# Patient Record
Sex: Male | Born: 1935
Health system: Southern US, Community
[De-identification: ages and names within clinical notes are randomized; demographics above are authoritative.]

## PROBLEM LIST (undated history)

## (undated) DIAGNOSIS — C801 Malignant (primary) neoplasm, unspecified: Secondary | ICD-10-CM

## (undated) HISTORY — PX: CATARACT EXTRACTION: SUR2

## (undated) HISTORY — PX: TONSILLECTOMY: SUR1361

## (undated) HISTORY — PX: REPLACEMENT TOTAL KNEE BILATERAL: SUR1225

---

## 2011-12-19 DIAGNOSIS — I10 Essential (primary) hypertension: Secondary | ICD-10-CM | POA: Diagnosis not present

## 2011-12-19 DIAGNOSIS — D649 Anemia, unspecified: Secondary | ICD-10-CM | POA: Diagnosis not present

## 2011-12-19 DIAGNOSIS — E7800 Pure hypercholesterolemia, unspecified: Secondary | ICD-10-CM | POA: Diagnosis not present

## 2011-12-19 DIAGNOSIS — D510 Vitamin B12 deficiency anemia due to intrinsic factor deficiency: Secondary | ICD-10-CM | POA: Diagnosis not present

## 2011-12-20 DIAGNOSIS — E7800 Pure hypercholesterolemia, unspecified: Secondary | ICD-10-CM | POA: Diagnosis not present

## 2011-12-20 DIAGNOSIS — D518 Other vitamin B12 deficiency anemias: Secondary | ICD-10-CM | POA: Diagnosis not present

## 2011-12-20 DIAGNOSIS — I10 Essential (primary) hypertension: Secondary | ICD-10-CM | POA: Diagnosis not present

## 2012-05-23 DIAGNOSIS — D649 Anemia, unspecified: Secondary | ICD-10-CM | POA: Diagnosis not present

## 2012-05-24 DIAGNOSIS — D509 Iron deficiency anemia, unspecified: Secondary | ICD-10-CM | POA: Diagnosis not present

## 2012-05-24 DIAGNOSIS — E538 Deficiency of other specified B group vitamins: Secondary | ICD-10-CM | POA: Diagnosis not present

## 2012-06-20 DIAGNOSIS — D509 Iron deficiency anemia, unspecified: Secondary | ICD-10-CM | POA: Diagnosis not present

## 2012-06-20 DIAGNOSIS — I10 Essential (primary) hypertension: Secondary | ICD-10-CM | POA: Diagnosis not present

## 2012-08-29 DIAGNOSIS — H2700 Aphakia, unspecified eye: Secondary | ICD-10-CM | POA: Diagnosis not present

## 2012-11-26 DIAGNOSIS — R29818 Other symptoms and signs involving the nervous system: Secondary | ICD-10-CM | POA: Diagnosis not present

## 2012-11-26 DIAGNOSIS — R5381 Other malaise: Secondary | ICD-10-CM | POA: Diagnosis not present

## 2012-11-26 DIAGNOSIS — I6789 Other cerebrovascular disease: Secondary | ICD-10-CM | POA: Diagnosis not present

## 2012-11-27 DIAGNOSIS — R29810 Facial weakness: Secondary | ICD-10-CM | POA: Diagnosis not present

## 2012-11-27 DIAGNOSIS — E538 Deficiency of other specified B group vitamins: Secondary | ICD-10-CM | POA: Diagnosis not present

## 2012-11-27 DIAGNOSIS — I5021 Acute systolic (congestive) heart failure: Secondary | ICD-10-CM | POA: Diagnosis not present

## 2012-11-27 DIAGNOSIS — M25449 Effusion, unspecified hand: Secondary | ICD-10-CM | POA: Diagnosis not present

## 2012-11-27 DIAGNOSIS — N289 Disorder of kidney and ureter, unspecified: Secondary | ICD-10-CM | POA: Diagnosis present

## 2012-11-27 DIAGNOSIS — Z96659 Presence of unspecified artificial knee joint: Secondary | ICD-10-CM | POA: Diagnosis not present

## 2012-11-27 DIAGNOSIS — E785 Hyperlipidemia, unspecified: Secondary | ICD-10-CM | POA: Diagnosis not present

## 2012-11-27 DIAGNOSIS — R7309 Other abnormal glucose: Secondary | ICD-10-CM | POA: Diagnosis not present

## 2012-11-27 DIAGNOSIS — R209 Unspecified disturbances of skin sensation: Secondary | ICD-10-CM | POA: Diagnosis not present

## 2012-11-27 DIAGNOSIS — I619 Nontraumatic intracerebral hemorrhage, unspecified: Secondary | ICD-10-CM | POA: Diagnosis not present

## 2012-11-27 DIAGNOSIS — I6350 Cerebral infarction due to unspecified occlusion or stenosis of unspecified cerebral artery: Secondary | ICD-10-CM | POA: Diagnosis not present

## 2012-11-27 DIAGNOSIS — I4891 Unspecified atrial fibrillation: Secondary | ICD-10-CM | POA: Diagnosis present

## 2012-11-27 DIAGNOSIS — I2589 Other forms of chronic ischemic heart disease: Secondary | ICD-10-CM | POA: Diagnosis not present

## 2012-11-27 DIAGNOSIS — I6789 Other cerebrovascular disease: Secondary | ICD-10-CM | POA: Diagnosis not present

## 2012-11-27 DIAGNOSIS — M6281 Muscle weakness (generalized): Secondary | ICD-10-CM | POA: Diagnosis not present

## 2012-11-27 DIAGNOSIS — G8190 Hemiplegia, unspecified affecting unspecified side: Secondary | ICD-10-CM | POA: Diagnosis present

## 2012-11-27 DIAGNOSIS — I6340 Cerebral infarction due to embolism of unspecified cerebral artery: Secondary | ICD-10-CM | POA: Diagnosis present

## 2012-11-27 DIAGNOSIS — I10 Essential (primary) hypertension: Secondary | ICD-10-CM | POA: Diagnosis not present

## 2012-12-05 DIAGNOSIS — Z5189 Encounter for other specified aftercare: Secondary | ICD-10-CM | POA: Diagnosis not present

## 2012-12-05 DIAGNOSIS — I6789 Other cerebrovascular disease: Secondary | ICD-10-CM | POA: Diagnosis not present

## 2012-12-05 DIAGNOSIS — R1312 Dysphagia, oropharyngeal phase: Secondary | ICD-10-CM | POA: Diagnosis not present

## 2012-12-05 DIAGNOSIS — M6281 Muscle weakness (generalized): Secondary | ICD-10-CM | POA: Diagnosis not present

## 2012-12-05 DIAGNOSIS — R279 Unspecified lack of coordination: Secondary | ICD-10-CM | POA: Diagnosis not present

## 2012-12-05 DIAGNOSIS — R269 Unspecified abnormalities of gait and mobility: Secondary | ICD-10-CM | POA: Diagnosis not present

## 2012-12-06 DIAGNOSIS — R269 Unspecified abnormalities of gait and mobility: Secondary | ICD-10-CM | POA: Diagnosis not present

## 2012-12-06 DIAGNOSIS — I6789 Other cerebrovascular disease: Secondary | ICD-10-CM | POA: Diagnosis not present

## 2012-12-06 DIAGNOSIS — M6281 Muscle weakness (generalized): Secondary | ICD-10-CM | POA: Diagnosis not present

## 2012-12-06 DIAGNOSIS — Z5189 Encounter for other specified aftercare: Secondary | ICD-10-CM | POA: Diagnosis not present

## 2012-12-06 DIAGNOSIS — R279 Unspecified lack of coordination: Secondary | ICD-10-CM | POA: Diagnosis not present

## 2012-12-06 DIAGNOSIS — I69959 Hemiplegia and hemiparesis following unspecified cerebrovascular disease affecting unspecified side: Secondary | ICD-10-CM | POA: Diagnosis not present

## 2012-12-06 DIAGNOSIS — R1312 Dysphagia, oropharyngeal phase: Secondary | ICD-10-CM | POA: Diagnosis not present

## 2012-12-09 DIAGNOSIS — R269 Unspecified abnormalities of gait and mobility: Secondary | ICD-10-CM | POA: Diagnosis not present

## 2012-12-09 DIAGNOSIS — R279 Unspecified lack of coordination: Secondary | ICD-10-CM | POA: Diagnosis not present

## 2012-12-09 DIAGNOSIS — Z5189 Encounter for other specified aftercare: Secondary | ICD-10-CM | POA: Diagnosis not present

## 2012-12-09 DIAGNOSIS — M6281 Muscle weakness (generalized): Secondary | ICD-10-CM | POA: Diagnosis not present

## 2012-12-09 DIAGNOSIS — I6789 Other cerebrovascular disease: Secondary | ICD-10-CM | POA: Diagnosis not present

## 2012-12-09 DIAGNOSIS — R1312 Dysphagia, oropharyngeal phase: Secondary | ICD-10-CM | POA: Diagnosis not present

## 2012-12-10 DIAGNOSIS — M6281 Muscle weakness (generalized): Secondary | ICD-10-CM | POA: Diagnosis not present

## 2012-12-10 DIAGNOSIS — R279 Unspecified lack of coordination: Secondary | ICD-10-CM | POA: Diagnosis not present

## 2012-12-10 DIAGNOSIS — Z5189 Encounter for other specified aftercare: Secondary | ICD-10-CM | POA: Diagnosis not present

## 2012-12-10 DIAGNOSIS — I6789 Other cerebrovascular disease: Secondary | ICD-10-CM | POA: Diagnosis not present

## 2012-12-10 DIAGNOSIS — R269 Unspecified abnormalities of gait and mobility: Secondary | ICD-10-CM | POA: Diagnosis not present

## 2012-12-10 DIAGNOSIS — R1312 Dysphagia, oropharyngeal phase: Secondary | ICD-10-CM | POA: Diagnosis not present

## 2012-12-11 DIAGNOSIS — Z5189 Encounter for other specified aftercare: Secondary | ICD-10-CM | POA: Diagnosis not present

## 2012-12-11 DIAGNOSIS — R269 Unspecified abnormalities of gait and mobility: Secondary | ICD-10-CM | POA: Diagnosis not present

## 2012-12-11 DIAGNOSIS — R1312 Dysphagia, oropharyngeal phase: Secondary | ICD-10-CM | POA: Diagnosis not present

## 2012-12-11 DIAGNOSIS — M6281 Muscle weakness (generalized): Secondary | ICD-10-CM | POA: Diagnosis not present

## 2012-12-11 DIAGNOSIS — I6789 Other cerebrovascular disease: Secondary | ICD-10-CM | POA: Diagnosis not present

## 2012-12-11 DIAGNOSIS — R279 Unspecified lack of coordination: Secondary | ICD-10-CM | POA: Diagnosis not present

## 2012-12-12 DIAGNOSIS — R279 Unspecified lack of coordination: Secondary | ICD-10-CM | POA: Diagnosis not present

## 2012-12-12 DIAGNOSIS — Z5189 Encounter for other specified aftercare: Secondary | ICD-10-CM | POA: Diagnosis not present

## 2012-12-12 DIAGNOSIS — R269 Unspecified abnormalities of gait and mobility: Secondary | ICD-10-CM | POA: Diagnosis not present

## 2012-12-12 DIAGNOSIS — M6281 Muscle weakness (generalized): Secondary | ICD-10-CM | POA: Diagnosis not present

## 2012-12-12 DIAGNOSIS — R1312 Dysphagia, oropharyngeal phase: Secondary | ICD-10-CM | POA: Diagnosis not present

## 2012-12-12 DIAGNOSIS — I6789 Other cerebrovascular disease: Secondary | ICD-10-CM | POA: Diagnosis not present

## 2012-12-13 DIAGNOSIS — R1312 Dysphagia, oropharyngeal phase: Secondary | ICD-10-CM | POA: Diagnosis not present

## 2012-12-13 DIAGNOSIS — I6789 Other cerebrovascular disease: Secondary | ICD-10-CM | POA: Diagnosis not present

## 2012-12-13 DIAGNOSIS — M6281 Muscle weakness (generalized): Secondary | ICD-10-CM | POA: Diagnosis not present

## 2012-12-13 DIAGNOSIS — R279 Unspecified lack of coordination: Secondary | ICD-10-CM | POA: Diagnosis not present

## 2012-12-13 DIAGNOSIS — R269 Unspecified abnormalities of gait and mobility: Secondary | ICD-10-CM | POA: Diagnosis not present

## 2012-12-13 DIAGNOSIS — Z5189 Encounter for other specified aftercare: Secondary | ICD-10-CM | POA: Diagnosis not present

## 2012-12-16 DIAGNOSIS — M6281 Muscle weakness (generalized): Secondary | ICD-10-CM | POA: Diagnosis not present

## 2012-12-16 DIAGNOSIS — R1312 Dysphagia, oropharyngeal phase: Secondary | ICD-10-CM | POA: Diagnosis not present

## 2012-12-16 DIAGNOSIS — R279 Unspecified lack of coordination: Secondary | ICD-10-CM | POA: Diagnosis not present

## 2012-12-16 DIAGNOSIS — I6789 Other cerebrovascular disease: Secondary | ICD-10-CM | POA: Diagnosis not present

## 2012-12-16 DIAGNOSIS — Z5189 Encounter for other specified aftercare: Secondary | ICD-10-CM | POA: Diagnosis not present

## 2012-12-16 DIAGNOSIS — R269 Unspecified abnormalities of gait and mobility: Secondary | ICD-10-CM | POA: Diagnosis not present

## 2012-12-17 DIAGNOSIS — Z5189 Encounter for other specified aftercare: Secondary | ICD-10-CM | POA: Diagnosis not present

## 2012-12-17 DIAGNOSIS — R269 Unspecified abnormalities of gait and mobility: Secondary | ICD-10-CM | POA: Diagnosis not present

## 2012-12-17 DIAGNOSIS — I6789 Other cerebrovascular disease: Secondary | ICD-10-CM | POA: Diagnosis not present

## 2012-12-17 DIAGNOSIS — R279 Unspecified lack of coordination: Secondary | ICD-10-CM | POA: Diagnosis not present

## 2012-12-17 DIAGNOSIS — M6281 Muscle weakness (generalized): Secondary | ICD-10-CM | POA: Diagnosis not present

## 2012-12-17 DIAGNOSIS — R1312 Dysphagia, oropharyngeal phase: Secondary | ICD-10-CM | POA: Diagnosis not present

## 2012-12-18 DIAGNOSIS — R279 Unspecified lack of coordination: Secondary | ICD-10-CM | POA: Diagnosis not present

## 2012-12-18 DIAGNOSIS — I6789 Other cerebrovascular disease: Secondary | ICD-10-CM | POA: Diagnosis not present

## 2012-12-18 DIAGNOSIS — R1312 Dysphagia, oropharyngeal phase: Secondary | ICD-10-CM | POA: Diagnosis not present

## 2012-12-18 DIAGNOSIS — R269 Unspecified abnormalities of gait and mobility: Secondary | ICD-10-CM | POA: Diagnosis not present

## 2012-12-18 DIAGNOSIS — M6281 Muscle weakness (generalized): Secondary | ICD-10-CM | POA: Diagnosis not present

## 2012-12-18 DIAGNOSIS — Z5189 Encounter for other specified aftercare: Secondary | ICD-10-CM | POA: Diagnosis not present

## 2012-12-19 DIAGNOSIS — M6281 Muscle weakness (generalized): Secondary | ICD-10-CM | POA: Diagnosis not present

## 2012-12-19 DIAGNOSIS — R269 Unspecified abnormalities of gait and mobility: Secondary | ICD-10-CM | POA: Diagnosis not present

## 2012-12-19 DIAGNOSIS — Z5189 Encounter for other specified aftercare: Secondary | ICD-10-CM | POA: Diagnosis not present

## 2012-12-19 DIAGNOSIS — R1312 Dysphagia, oropharyngeal phase: Secondary | ICD-10-CM | POA: Diagnosis not present

## 2012-12-19 DIAGNOSIS — R279 Unspecified lack of coordination: Secondary | ICD-10-CM | POA: Diagnosis not present

## 2012-12-19 DIAGNOSIS — I6789 Other cerebrovascular disease: Secondary | ICD-10-CM | POA: Diagnosis not present

## 2012-12-20 DIAGNOSIS — R1312 Dysphagia, oropharyngeal phase: Secondary | ICD-10-CM | POA: Diagnosis not present

## 2012-12-20 DIAGNOSIS — R269 Unspecified abnormalities of gait and mobility: Secondary | ICD-10-CM | POA: Diagnosis not present

## 2012-12-20 DIAGNOSIS — R279 Unspecified lack of coordination: Secondary | ICD-10-CM | POA: Diagnosis not present

## 2012-12-20 DIAGNOSIS — I6789 Other cerebrovascular disease: Secondary | ICD-10-CM | POA: Diagnosis not present

## 2012-12-20 DIAGNOSIS — M6281 Muscle weakness (generalized): Secondary | ICD-10-CM | POA: Diagnosis not present

## 2012-12-20 DIAGNOSIS — Z5189 Encounter for other specified aftercare: Secondary | ICD-10-CM | POA: Diagnosis not present

## 2012-12-23 DIAGNOSIS — M6281 Muscle weakness (generalized): Secondary | ICD-10-CM | POA: Diagnosis not present

## 2012-12-23 DIAGNOSIS — I6789 Other cerebrovascular disease: Secondary | ICD-10-CM | POA: Diagnosis not present

## 2012-12-23 DIAGNOSIS — Z5189 Encounter for other specified aftercare: Secondary | ICD-10-CM | POA: Diagnosis not present

## 2012-12-23 DIAGNOSIS — R279 Unspecified lack of coordination: Secondary | ICD-10-CM | POA: Diagnosis not present

## 2012-12-23 DIAGNOSIS — R269 Unspecified abnormalities of gait and mobility: Secondary | ICD-10-CM | POA: Diagnosis not present

## 2012-12-23 DIAGNOSIS — R1312 Dysphagia, oropharyngeal phase: Secondary | ICD-10-CM | POA: Diagnosis not present

## 2012-12-24 DIAGNOSIS — Z5189 Encounter for other specified aftercare: Secondary | ICD-10-CM | POA: Diagnosis not present

## 2012-12-24 DIAGNOSIS — R279 Unspecified lack of coordination: Secondary | ICD-10-CM | POA: Diagnosis not present

## 2012-12-24 DIAGNOSIS — I6789 Other cerebrovascular disease: Secondary | ICD-10-CM | POA: Diagnosis not present

## 2012-12-24 DIAGNOSIS — R1312 Dysphagia, oropharyngeal phase: Secondary | ICD-10-CM | POA: Diagnosis not present

## 2012-12-24 DIAGNOSIS — M6281 Muscle weakness (generalized): Secondary | ICD-10-CM | POA: Diagnosis not present

## 2012-12-24 DIAGNOSIS — R269 Unspecified abnormalities of gait and mobility: Secondary | ICD-10-CM | POA: Diagnosis not present

## 2012-12-25 DIAGNOSIS — R269 Unspecified abnormalities of gait and mobility: Secondary | ICD-10-CM | POA: Diagnosis not present

## 2012-12-25 DIAGNOSIS — R1312 Dysphagia, oropharyngeal phase: Secondary | ICD-10-CM | POA: Diagnosis not present

## 2012-12-25 DIAGNOSIS — Z5189 Encounter for other specified aftercare: Secondary | ICD-10-CM | POA: Diagnosis not present

## 2012-12-25 DIAGNOSIS — I6789 Other cerebrovascular disease: Secondary | ICD-10-CM | POA: Diagnosis not present

## 2012-12-25 DIAGNOSIS — M6281 Muscle weakness (generalized): Secondary | ICD-10-CM | POA: Diagnosis not present

## 2012-12-25 DIAGNOSIS — R279 Unspecified lack of coordination: Secondary | ICD-10-CM | POA: Diagnosis not present

## 2012-12-26 DIAGNOSIS — I6350 Cerebral infarction due to unspecified occlusion or stenosis of unspecified cerebral artery: Secondary | ICD-10-CM | POA: Diagnosis not present

## 2012-12-26 DIAGNOSIS — Z5189 Encounter for other specified aftercare: Secondary | ICD-10-CM | POA: Diagnosis not present

## 2012-12-26 DIAGNOSIS — E7800 Pure hypercholesterolemia, unspecified: Secondary | ICD-10-CM | POA: Diagnosis not present

## 2012-12-26 DIAGNOSIS — R1312 Dysphagia, oropharyngeal phase: Secondary | ICD-10-CM | POA: Diagnosis not present

## 2012-12-26 DIAGNOSIS — I4891 Unspecified atrial fibrillation: Secondary | ICD-10-CM | POA: Diagnosis not present

## 2012-12-26 DIAGNOSIS — R269 Unspecified abnormalities of gait and mobility: Secondary | ICD-10-CM | POA: Diagnosis not present

## 2012-12-26 DIAGNOSIS — I6789 Other cerebrovascular disease: Secondary | ICD-10-CM | POA: Diagnosis not present

## 2012-12-26 DIAGNOSIS — R279 Unspecified lack of coordination: Secondary | ICD-10-CM | POA: Diagnosis not present

## 2012-12-26 DIAGNOSIS — M6281 Muscle weakness (generalized): Secondary | ICD-10-CM | POA: Diagnosis not present

## 2012-12-27 DIAGNOSIS — R279 Unspecified lack of coordination: Secondary | ICD-10-CM | POA: Diagnosis not present

## 2012-12-27 DIAGNOSIS — R269 Unspecified abnormalities of gait and mobility: Secondary | ICD-10-CM | POA: Diagnosis not present

## 2012-12-27 DIAGNOSIS — R1312 Dysphagia, oropharyngeal phase: Secondary | ICD-10-CM | POA: Diagnosis not present

## 2012-12-27 DIAGNOSIS — M6281 Muscle weakness (generalized): Secondary | ICD-10-CM | POA: Diagnosis not present

## 2012-12-27 DIAGNOSIS — Z5189 Encounter for other specified aftercare: Secondary | ICD-10-CM | POA: Diagnosis not present

## 2012-12-27 DIAGNOSIS — I6789 Other cerebrovascular disease: Secondary | ICD-10-CM | POA: Diagnosis not present

## 2012-12-30 DIAGNOSIS — M6281 Muscle weakness (generalized): Secondary | ICD-10-CM | POA: Diagnosis not present

## 2012-12-30 DIAGNOSIS — R279 Unspecified lack of coordination: Secondary | ICD-10-CM | POA: Diagnosis not present

## 2012-12-30 DIAGNOSIS — R269 Unspecified abnormalities of gait and mobility: Secondary | ICD-10-CM | POA: Diagnosis not present

## 2012-12-30 DIAGNOSIS — R1312 Dysphagia, oropharyngeal phase: Secondary | ICD-10-CM | POA: Diagnosis not present

## 2012-12-30 DIAGNOSIS — I6789 Other cerebrovascular disease: Secondary | ICD-10-CM | POA: Diagnosis not present

## 2012-12-30 DIAGNOSIS — Z5189 Encounter for other specified aftercare: Secondary | ICD-10-CM | POA: Diagnosis not present

## 2012-12-31 DIAGNOSIS — Z5189 Encounter for other specified aftercare: Secondary | ICD-10-CM | POA: Diagnosis not present

## 2012-12-31 DIAGNOSIS — R269 Unspecified abnormalities of gait and mobility: Secondary | ICD-10-CM | POA: Diagnosis not present

## 2012-12-31 DIAGNOSIS — R1312 Dysphagia, oropharyngeal phase: Secondary | ICD-10-CM | POA: Diagnosis not present

## 2012-12-31 DIAGNOSIS — I6789 Other cerebrovascular disease: Secondary | ICD-10-CM | POA: Diagnosis not present

## 2012-12-31 DIAGNOSIS — R279 Unspecified lack of coordination: Secondary | ICD-10-CM | POA: Diagnosis not present

## 2012-12-31 DIAGNOSIS — M6281 Muscle weakness (generalized): Secondary | ICD-10-CM | POA: Diagnosis not present

## 2013-01-01 DIAGNOSIS — I6789 Other cerebrovascular disease: Secondary | ICD-10-CM | POA: Diagnosis not present

## 2013-01-01 DIAGNOSIS — M6281 Muscle weakness (generalized): Secondary | ICD-10-CM | POA: Diagnosis not present

## 2013-01-01 DIAGNOSIS — R279 Unspecified lack of coordination: Secondary | ICD-10-CM | POA: Diagnosis not present

## 2013-01-01 DIAGNOSIS — R269 Unspecified abnormalities of gait and mobility: Secondary | ICD-10-CM | POA: Diagnosis not present

## 2013-01-01 DIAGNOSIS — Z5189 Encounter for other specified aftercare: Secondary | ICD-10-CM | POA: Diagnosis not present

## 2013-01-01 DIAGNOSIS — R1312 Dysphagia, oropharyngeal phase: Secondary | ICD-10-CM | POA: Diagnosis not present

## 2013-01-02 DIAGNOSIS — Z5189 Encounter for other specified aftercare: Secondary | ICD-10-CM | POA: Diagnosis not present

## 2013-01-02 DIAGNOSIS — M6281 Muscle weakness (generalized): Secondary | ICD-10-CM | POA: Diagnosis not present

## 2013-01-02 DIAGNOSIS — R269 Unspecified abnormalities of gait and mobility: Secondary | ICD-10-CM | POA: Diagnosis not present

## 2013-01-02 DIAGNOSIS — I6789 Other cerebrovascular disease: Secondary | ICD-10-CM | POA: Diagnosis not present

## 2013-01-02 DIAGNOSIS — R1312 Dysphagia, oropharyngeal phase: Secondary | ICD-10-CM | POA: Diagnosis not present

## 2013-01-02 DIAGNOSIS — R279 Unspecified lack of coordination: Secondary | ICD-10-CM | POA: Diagnosis not present

## 2013-01-03 DIAGNOSIS — I6789 Other cerebrovascular disease: Secondary | ICD-10-CM | POA: Diagnosis not present

## 2013-01-03 DIAGNOSIS — Z5189 Encounter for other specified aftercare: Secondary | ICD-10-CM | POA: Diagnosis not present

## 2013-01-03 DIAGNOSIS — R269 Unspecified abnormalities of gait and mobility: Secondary | ICD-10-CM | POA: Diagnosis not present

## 2013-01-03 DIAGNOSIS — R1312 Dysphagia, oropharyngeal phase: Secondary | ICD-10-CM | POA: Diagnosis not present

## 2013-01-03 DIAGNOSIS — R279 Unspecified lack of coordination: Secondary | ICD-10-CM | POA: Diagnosis not present

## 2013-01-03 DIAGNOSIS — M6281 Muscle weakness (generalized): Secondary | ICD-10-CM | POA: Diagnosis not present

## 2013-01-06 DIAGNOSIS — M6281 Muscle weakness (generalized): Secondary | ICD-10-CM | POA: Diagnosis not present

## 2013-01-06 DIAGNOSIS — R279 Unspecified lack of coordination: Secondary | ICD-10-CM | POA: Diagnosis not present

## 2013-01-06 DIAGNOSIS — R1312 Dysphagia, oropharyngeal phase: Secondary | ICD-10-CM | POA: Diagnosis not present

## 2013-01-06 DIAGNOSIS — I6789 Other cerebrovascular disease: Secondary | ICD-10-CM | POA: Diagnosis not present

## 2013-01-06 DIAGNOSIS — Z5189 Encounter for other specified aftercare: Secondary | ICD-10-CM | POA: Diagnosis not present

## 2013-01-06 DIAGNOSIS — R269 Unspecified abnormalities of gait and mobility: Secondary | ICD-10-CM | POA: Diagnosis not present

## 2013-01-07 DIAGNOSIS — I10 Essential (primary) hypertension: Secondary | ICD-10-CM | POA: Diagnosis not present

## 2013-01-07 DIAGNOSIS — I4891 Unspecified atrial fibrillation: Secondary | ICD-10-CM | POA: Diagnosis not present

## 2013-01-07 DIAGNOSIS — E785 Hyperlipidemia, unspecified: Secondary | ICD-10-CM | POA: Diagnosis not present

## 2013-01-08 DIAGNOSIS — R279 Unspecified lack of coordination: Secondary | ICD-10-CM | POA: Diagnosis not present

## 2013-01-08 DIAGNOSIS — R1312 Dysphagia, oropharyngeal phase: Secondary | ICD-10-CM | POA: Diagnosis not present

## 2013-01-08 DIAGNOSIS — I6789 Other cerebrovascular disease: Secondary | ICD-10-CM | POA: Diagnosis not present

## 2013-01-08 DIAGNOSIS — R269 Unspecified abnormalities of gait and mobility: Secondary | ICD-10-CM | POA: Diagnosis not present

## 2013-01-08 DIAGNOSIS — Z5189 Encounter for other specified aftercare: Secondary | ICD-10-CM | POA: Diagnosis not present

## 2013-01-08 DIAGNOSIS — M6281 Muscle weakness (generalized): Secondary | ICD-10-CM | POA: Diagnosis not present

## 2013-01-09 DIAGNOSIS — R279 Unspecified lack of coordination: Secondary | ICD-10-CM | POA: Diagnosis not present

## 2013-01-09 DIAGNOSIS — Z5189 Encounter for other specified aftercare: Secondary | ICD-10-CM | POA: Diagnosis not present

## 2013-01-09 DIAGNOSIS — R269 Unspecified abnormalities of gait and mobility: Secondary | ICD-10-CM | POA: Diagnosis not present

## 2013-01-09 DIAGNOSIS — I6789 Other cerebrovascular disease: Secondary | ICD-10-CM | POA: Diagnosis not present

## 2013-01-09 DIAGNOSIS — R1312 Dysphagia, oropharyngeal phase: Secondary | ICD-10-CM | POA: Diagnosis not present

## 2013-01-09 DIAGNOSIS — M6281 Muscle weakness (generalized): Secondary | ICD-10-CM | POA: Diagnosis not present

## 2013-01-10 DIAGNOSIS — I6350 Cerebral infarction due to unspecified occlusion or stenosis of unspecified cerebral artery: Secondary | ICD-10-CM | POA: Diagnosis not present

## 2013-01-10 DIAGNOSIS — Z961 Presence of intraocular lens: Secondary | ICD-10-CM | POA: Diagnosis not present

## 2013-01-13 DIAGNOSIS — R269 Unspecified abnormalities of gait and mobility: Secondary | ICD-10-CM | POA: Diagnosis not present

## 2013-01-13 DIAGNOSIS — Z5189 Encounter for other specified aftercare: Secondary | ICD-10-CM | POA: Diagnosis not present

## 2013-01-13 DIAGNOSIS — M6281 Muscle weakness (generalized): Secondary | ICD-10-CM | POA: Diagnosis not present

## 2013-01-13 DIAGNOSIS — I6789 Other cerebrovascular disease: Secondary | ICD-10-CM | POA: Diagnosis not present

## 2013-01-13 DIAGNOSIS — R1312 Dysphagia, oropharyngeal phase: Secondary | ICD-10-CM | POA: Diagnosis not present

## 2013-01-13 DIAGNOSIS — R279 Unspecified lack of coordination: Secondary | ICD-10-CM | POA: Diagnosis not present

## 2013-01-14 DIAGNOSIS — Z5189 Encounter for other specified aftercare: Secondary | ICD-10-CM | POA: Diagnosis not present

## 2013-01-14 DIAGNOSIS — R1312 Dysphagia, oropharyngeal phase: Secondary | ICD-10-CM | POA: Diagnosis not present

## 2013-01-14 DIAGNOSIS — M6281 Muscle weakness (generalized): Secondary | ICD-10-CM | POA: Diagnosis not present

## 2013-01-14 DIAGNOSIS — R269 Unspecified abnormalities of gait and mobility: Secondary | ICD-10-CM | POA: Diagnosis not present

## 2013-01-14 DIAGNOSIS — R279 Unspecified lack of coordination: Secondary | ICD-10-CM | POA: Diagnosis not present

## 2013-01-14 DIAGNOSIS — I6789 Other cerebrovascular disease: Secondary | ICD-10-CM | POA: Diagnosis not present

## 2013-01-15 DIAGNOSIS — I6789 Other cerebrovascular disease: Secondary | ICD-10-CM | POA: Diagnosis not present

## 2013-01-15 DIAGNOSIS — D649 Anemia, unspecified: Secondary | ICD-10-CM | POA: Diagnosis not present

## 2013-01-15 DIAGNOSIS — M6281 Muscle weakness (generalized): Secondary | ICD-10-CM | POA: Diagnosis not present

## 2013-01-15 DIAGNOSIS — Z79899 Other long term (current) drug therapy: Secondary | ICD-10-CM | POA: Diagnosis not present

## 2013-01-15 DIAGNOSIS — Z5189 Encounter for other specified aftercare: Secondary | ICD-10-CM | POA: Diagnosis not present

## 2013-01-15 DIAGNOSIS — R269 Unspecified abnormalities of gait and mobility: Secondary | ICD-10-CM | POA: Diagnosis not present

## 2013-01-15 DIAGNOSIS — R1312 Dysphagia, oropharyngeal phase: Secondary | ICD-10-CM | POA: Diagnosis not present

## 2013-01-15 DIAGNOSIS — R279 Unspecified lack of coordination: Secondary | ICD-10-CM | POA: Diagnosis not present

## 2013-01-16 DIAGNOSIS — I6340 Cerebral infarction due to embolism of unspecified cerebral artery: Secondary | ICD-10-CM | POA: Diagnosis not present

## 2013-01-16 DIAGNOSIS — H53469 Homonymous bilateral field defects, unspecified side: Secondary | ICD-10-CM | POA: Diagnosis not present

## 2013-01-21 DIAGNOSIS — I4891 Unspecified atrial fibrillation: Secondary | ICD-10-CM | POA: Diagnosis not present

## 2013-01-21 DIAGNOSIS — I6789 Other cerebrovascular disease: Secondary | ICD-10-CM | POA: Diagnosis not present

## 2013-01-21 DIAGNOSIS — I4460 Unspecified fascicular block: Secondary | ICD-10-CM | POA: Diagnosis not present

## 2013-01-21 DIAGNOSIS — E785 Hyperlipidemia, unspecified: Secondary | ICD-10-CM | POA: Diagnosis not present

## 2013-01-21 DIAGNOSIS — I10 Essential (primary) hypertension: Secondary | ICD-10-CM | POA: Diagnosis not present

## 2013-01-22 DIAGNOSIS — M6281 Muscle weakness (generalized): Secondary | ICD-10-CM | POA: Diagnosis not present

## 2013-01-22 DIAGNOSIS — Z5189 Encounter for other specified aftercare: Secondary | ICD-10-CM | POA: Diagnosis not present

## 2013-01-22 DIAGNOSIS — R279 Unspecified lack of coordination: Secondary | ICD-10-CM | POA: Diagnosis not present

## 2013-01-22 DIAGNOSIS — I6789 Other cerebrovascular disease: Secondary | ICD-10-CM | POA: Diagnosis not present

## 2013-01-23 DIAGNOSIS — R279 Unspecified lack of coordination: Secondary | ICD-10-CM | POA: Diagnosis not present

## 2013-01-23 DIAGNOSIS — Z5189 Encounter for other specified aftercare: Secondary | ICD-10-CM | POA: Diagnosis not present

## 2013-01-23 DIAGNOSIS — I6789 Other cerebrovascular disease: Secondary | ICD-10-CM | POA: Diagnosis not present

## 2013-01-23 DIAGNOSIS — M6281 Muscle weakness (generalized): Secondary | ICD-10-CM | POA: Diagnosis not present

## 2013-01-24 DIAGNOSIS — R279 Unspecified lack of coordination: Secondary | ICD-10-CM | POA: Diagnosis not present

## 2013-01-24 DIAGNOSIS — M6281 Muscle weakness (generalized): Secondary | ICD-10-CM | POA: Diagnosis not present

## 2013-01-24 DIAGNOSIS — Z5189 Encounter for other specified aftercare: Secondary | ICD-10-CM | POA: Diagnosis not present

## 2013-01-24 DIAGNOSIS — I6789 Other cerebrovascular disease: Secondary | ICD-10-CM | POA: Diagnosis not present

## 2013-01-27 DIAGNOSIS — R279 Unspecified lack of coordination: Secondary | ICD-10-CM | POA: Diagnosis not present

## 2013-01-27 DIAGNOSIS — M6281 Muscle weakness (generalized): Secondary | ICD-10-CM | POA: Diagnosis not present

## 2013-01-27 DIAGNOSIS — Z5189 Encounter for other specified aftercare: Secondary | ICD-10-CM | POA: Diagnosis not present

## 2013-01-27 DIAGNOSIS — I6789 Other cerebrovascular disease: Secondary | ICD-10-CM | POA: Diagnosis not present

## 2013-01-28 DIAGNOSIS — I10 Essential (primary) hypertension: Secondary | ICD-10-CM | POA: Diagnosis not present

## 2013-01-28 DIAGNOSIS — I4891 Unspecified atrial fibrillation: Secondary | ICD-10-CM | POA: Diagnosis not present

## 2013-01-28 DIAGNOSIS — E785 Hyperlipidemia, unspecified: Secondary | ICD-10-CM | POA: Diagnosis not present

## 2013-01-29 DIAGNOSIS — Z5189 Encounter for other specified aftercare: Secondary | ICD-10-CM | POA: Diagnosis not present

## 2013-01-29 DIAGNOSIS — R279 Unspecified lack of coordination: Secondary | ICD-10-CM | POA: Diagnosis not present

## 2013-01-29 DIAGNOSIS — I6789 Other cerebrovascular disease: Secondary | ICD-10-CM | POA: Diagnosis not present

## 2013-01-29 DIAGNOSIS — M6281 Muscle weakness (generalized): Secondary | ICD-10-CM | POA: Diagnosis not present

## 2013-01-30 DIAGNOSIS — M6281 Muscle weakness (generalized): Secondary | ICD-10-CM | POA: Diagnosis not present

## 2013-01-30 DIAGNOSIS — R279 Unspecified lack of coordination: Secondary | ICD-10-CM | POA: Diagnosis not present

## 2013-01-30 DIAGNOSIS — Z5189 Encounter for other specified aftercare: Secondary | ICD-10-CM | POA: Diagnosis not present

## 2013-01-30 DIAGNOSIS — I6789 Other cerebrovascular disease: Secondary | ICD-10-CM | POA: Diagnosis not present

## 2013-02-03 DIAGNOSIS — Z5189 Encounter for other specified aftercare: Secondary | ICD-10-CM | POA: Diagnosis not present

## 2013-02-03 DIAGNOSIS — M6281 Muscle weakness (generalized): Secondary | ICD-10-CM | POA: Diagnosis not present

## 2013-02-03 DIAGNOSIS — I6789 Other cerebrovascular disease: Secondary | ICD-10-CM | POA: Diagnosis not present

## 2013-02-03 DIAGNOSIS — R279 Unspecified lack of coordination: Secondary | ICD-10-CM | POA: Diagnosis not present

## 2013-02-04 DIAGNOSIS — R279 Unspecified lack of coordination: Secondary | ICD-10-CM | POA: Diagnosis not present

## 2013-02-04 DIAGNOSIS — I6789 Other cerebrovascular disease: Secondary | ICD-10-CM | POA: Diagnosis not present

## 2013-02-04 DIAGNOSIS — M6281 Muscle weakness (generalized): Secondary | ICD-10-CM | POA: Diagnosis not present

## 2013-02-04 DIAGNOSIS — Z5189 Encounter for other specified aftercare: Secondary | ICD-10-CM | POA: Diagnosis not present

## 2013-02-05 DIAGNOSIS — M6281 Muscle weakness (generalized): Secondary | ICD-10-CM | POA: Diagnosis not present

## 2013-02-05 DIAGNOSIS — I6789 Other cerebrovascular disease: Secondary | ICD-10-CM | POA: Diagnosis not present

## 2013-02-05 DIAGNOSIS — Z5189 Encounter for other specified aftercare: Secondary | ICD-10-CM | POA: Diagnosis not present

## 2013-02-05 DIAGNOSIS — R279 Unspecified lack of coordination: Secondary | ICD-10-CM | POA: Diagnosis not present

## 2013-02-10 DIAGNOSIS — I6789 Other cerebrovascular disease: Secondary | ICD-10-CM | POA: Diagnosis not present

## 2013-02-10 DIAGNOSIS — R279 Unspecified lack of coordination: Secondary | ICD-10-CM | POA: Diagnosis not present

## 2013-02-10 DIAGNOSIS — M6281 Muscle weakness (generalized): Secondary | ICD-10-CM | POA: Diagnosis not present

## 2013-02-10 DIAGNOSIS — Z5189 Encounter for other specified aftercare: Secondary | ICD-10-CM | POA: Diagnosis not present

## 2013-02-11 DIAGNOSIS — R279 Unspecified lack of coordination: Secondary | ICD-10-CM | POA: Diagnosis not present

## 2013-02-11 DIAGNOSIS — M6281 Muscle weakness (generalized): Secondary | ICD-10-CM | POA: Diagnosis not present

## 2013-02-11 DIAGNOSIS — I6789 Other cerebrovascular disease: Secondary | ICD-10-CM | POA: Diagnosis not present

## 2013-02-11 DIAGNOSIS — Z5189 Encounter for other specified aftercare: Secondary | ICD-10-CM | POA: Diagnosis not present

## 2013-02-12 DIAGNOSIS — I6789 Other cerebrovascular disease: Secondary | ICD-10-CM | POA: Diagnosis not present

## 2013-02-12 DIAGNOSIS — Z5189 Encounter for other specified aftercare: Secondary | ICD-10-CM | POA: Diagnosis not present

## 2013-02-12 DIAGNOSIS — R279 Unspecified lack of coordination: Secondary | ICD-10-CM | POA: Diagnosis not present

## 2013-02-12 DIAGNOSIS — M6281 Muscle weakness (generalized): Secondary | ICD-10-CM | POA: Diagnosis not present

## 2013-02-17 DIAGNOSIS — R279 Unspecified lack of coordination: Secondary | ICD-10-CM | POA: Diagnosis not present

## 2013-02-17 DIAGNOSIS — I6789 Other cerebrovascular disease: Secondary | ICD-10-CM | POA: Diagnosis not present

## 2013-02-17 DIAGNOSIS — M6281 Muscle weakness (generalized): Secondary | ICD-10-CM | POA: Diagnosis not present

## 2013-02-17 DIAGNOSIS — Z5189 Encounter for other specified aftercare: Secondary | ICD-10-CM | POA: Diagnosis not present

## 2013-02-18 DIAGNOSIS — Z5189 Encounter for other specified aftercare: Secondary | ICD-10-CM | POA: Diagnosis not present

## 2013-02-18 DIAGNOSIS — R279 Unspecified lack of coordination: Secondary | ICD-10-CM | POA: Diagnosis not present

## 2013-02-18 DIAGNOSIS — M6281 Muscle weakness (generalized): Secondary | ICD-10-CM | POA: Diagnosis not present

## 2013-02-18 DIAGNOSIS — I6789 Other cerebrovascular disease: Secondary | ICD-10-CM | POA: Diagnosis not present

## 2013-02-19 DIAGNOSIS — I6789 Other cerebrovascular disease: Secondary | ICD-10-CM | POA: Diagnosis not present

## 2013-02-19 DIAGNOSIS — Z5189 Encounter for other specified aftercare: Secondary | ICD-10-CM | POA: Diagnosis not present

## 2013-02-19 DIAGNOSIS — M6281 Muscle weakness (generalized): Secondary | ICD-10-CM | POA: Diagnosis not present

## 2013-04-03 DIAGNOSIS — R5381 Other malaise: Secondary | ICD-10-CM | POA: Diagnosis not present

## 2013-04-03 DIAGNOSIS — I10 Essential (primary) hypertension: Secondary | ICD-10-CM | POA: Diagnosis not present

## 2013-04-03 DIAGNOSIS — E785 Hyperlipidemia, unspecified: Secondary | ICD-10-CM | POA: Diagnosis not present

## 2013-04-03 DIAGNOSIS — I4891 Unspecified atrial fibrillation: Secondary | ICD-10-CM | POA: Diagnosis not present

## 2013-04-07 DIAGNOSIS — I69959 Hemiplegia and hemiparesis following unspecified cerebrovascular disease affecting unspecified side: Secondary | ICD-10-CM | POA: Diagnosis not present

## 2013-04-07 DIAGNOSIS — E7800 Pure hypercholesterolemia, unspecified: Secondary | ICD-10-CM | POA: Diagnosis not present

## 2013-04-07 DIAGNOSIS — I4891 Unspecified atrial fibrillation: Secondary | ICD-10-CM | POA: Diagnosis not present

## 2013-07-14 DIAGNOSIS — Z79899 Other long term (current) drug therapy: Secondary | ICD-10-CM | POA: Diagnosis not present

## 2013-07-22 DIAGNOSIS — I4891 Unspecified atrial fibrillation: Secondary | ICD-10-CM | POA: Diagnosis not present

## 2013-07-22 DIAGNOSIS — I6789 Other cerebrovascular disease: Secondary | ICD-10-CM | POA: Diagnosis not present

## 2013-07-22 DIAGNOSIS — E7800 Pure hypercholesterolemia, unspecified: Secondary | ICD-10-CM | POA: Diagnosis not present

## 2013-09-23 DIAGNOSIS — E785 Hyperlipidemia, unspecified: Secondary | ICD-10-CM | POA: Diagnosis not present

## 2013-09-23 DIAGNOSIS — R609 Edema, unspecified: Secondary | ICD-10-CM | POA: Diagnosis not present

## 2013-09-23 DIAGNOSIS — I4891 Unspecified atrial fibrillation: Secondary | ICD-10-CM | POA: Diagnosis not present

## 2013-09-23 DIAGNOSIS — I6789 Other cerebrovascular disease: Secondary | ICD-10-CM | POA: Diagnosis not present

## 2013-11-26 DIAGNOSIS — I10 Essential (primary) hypertension: Secondary | ICD-10-CM | POA: Diagnosis not present

## 2013-11-26 DIAGNOSIS — E785 Hyperlipidemia, unspecified: Secondary | ICD-10-CM | POA: Diagnosis not present

## 2013-11-27 DIAGNOSIS — E785 Hyperlipidemia, unspecified: Secondary | ICD-10-CM | POA: Diagnosis not present

## 2013-11-27 DIAGNOSIS — I4891 Unspecified atrial fibrillation: Secondary | ICD-10-CM | POA: Diagnosis not present

## 2013-11-27 DIAGNOSIS — Z7901 Long term (current) use of anticoagulants: Secondary | ICD-10-CM | POA: Diagnosis not present

## 2013-11-27 DIAGNOSIS — I6990 Unspecified sequelae of unspecified cerebrovascular disease: Secondary | ICD-10-CM | POA: Diagnosis not present

## 2013-12-03 DIAGNOSIS — I4891 Unspecified atrial fibrillation: Secondary | ICD-10-CM | POA: Diagnosis not present

## 2013-12-03 DIAGNOSIS — I119 Hypertensive heart disease without heart failure: Secondary | ICD-10-CM | POA: Diagnosis not present

## 2013-12-17 DIAGNOSIS — I4891 Unspecified atrial fibrillation: Secondary | ICD-10-CM | POA: Diagnosis not present

## 2013-12-22 DIAGNOSIS — I4891 Unspecified atrial fibrillation: Secondary | ICD-10-CM | POA: Diagnosis not present

## 2014-05-19 DIAGNOSIS — I639 Cerebral infarction, unspecified: Secondary | ICD-10-CM | POA: Diagnosis not present

## 2014-05-19 DIAGNOSIS — E785 Hyperlipidemia, unspecified: Secondary | ICD-10-CM | POA: Diagnosis not present

## 2014-05-19 DIAGNOSIS — I10 Essential (primary) hypertension: Secondary | ICD-10-CM | POA: Diagnosis not present

## 2014-05-27 DIAGNOSIS — H26493 Other secondary cataract, bilateral: Secondary | ICD-10-CM | POA: Diagnosis not present

## 2014-06-16 DIAGNOSIS — E785 Hyperlipidemia, unspecified: Secondary | ICD-10-CM | POA: Diagnosis not present

## 2014-06-16 DIAGNOSIS — Z7901 Long term (current) use of anticoagulants: Secondary | ICD-10-CM | POA: Diagnosis not present

## 2014-06-16 DIAGNOSIS — I10 Essential (primary) hypertension: Secondary | ICD-10-CM | POA: Diagnosis not present

## 2014-06-16 DIAGNOSIS — G8194 Hemiplegia, unspecified affecting left nondominant side: Secondary | ICD-10-CM | POA: Diagnosis not present

## 2014-06-25 DIAGNOSIS — E785 Hyperlipidemia, unspecified: Secondary | ICD-10-CM | POA: Diagnosis not present

## 2014-06-25 DIAGNOSIS — I119 Hypertensive heart disease without heart failure: Secondary | ICD-10-CM | POA: Diagnosis not present

## 2014-06-25 DIAGNOSIS — I482 Chronic atrial fibrillation: Secondary | ICD-10-CM | POA: Diagnosis not present

## 2014-06-25 DIAGNOSIS — Z7901 Long term (current) use of anticoagulants: Secondary | ICD-10-CM | POA: Diagnosis not present

## 2014-11-17 DIAGNOSIS — E785 Hyperlipidemia, unspecified: Secondary | ICD-10-CM | POA: Diagnosis not present

## 2014-11-17 DIAGNOSIS — I10 Essential (primary) hypertension: Secondary | ICD-10-CM | POA: Diagnosis not present

## 2014-11-17 DIAGNOSIS — G8194 Hemiplegia, unspecified affecting left nondominant side: Secondary | ICD-10-CM | POA: Diagnosis not present

## 2014-11-17 DIAGNOSIS — I4891 Unspecified atrial fibrillation: Secondary | ICD-10-CM | POA: Diagnosis not present

## 2014-11-17 DIAGNOSIS — Z7901 Long term (current) use of anticoagulants: Secondary | ICD-10-CM | POA: Diagnosis not present

## 2014-12-15 DIAGNOSIS — I4891 Unspecified atrial fibrillation: Secondary | ICD-10-CM | POA: Diagnosis not present

## 2014-12-15 DIAGNOSIS — I10 Essential (primary) hypertension: Secondary | ICD-10-CM | POA: Diagnosis not present

## 2014-12-15 DIAGNOSIS — E785 Hyperlipidemia, unspecified: Secondary | ICD-10-CM | POA: Diagnosis not present

## 2014-12-15 DIAGNOSIS — I639 Cerebral infarction, unspecified: Secondary | ICD-10-CM | POA: Diagnosis not present

## 2015-02-24 DIAGNOSIS — G8194 Hemiplegia, unspecified affecting left nondominant side: Secondary | ICD-10-CM | POA: Diagnosis not present

## 2015-02-24 DIAGNOSIS — I4891 Unspecified atrial fibrillation: Secondary | ICD-10-CM | POA: Diagnosis not present

## 2015-02-24 DIAGNOSIS — R1902 Left upper quadrant abdominal swelling, mass and lump: Secondary | ICD-10-CM | POA: Diagnosis not present

## 2015-02-24 DIAGNOSIS — Z7901 Long term (current) use of anticoagulants: Secondary | ICD-10-CM | POA: Diagnosis not present

## 2015-02-24 DIAGNOSIS — I10 Essential (primary) hypertension: Secondary | ICD-10-CM | POA: Diagnosis not present

## 2015-03-17 DIAGNOSIS — L570 Actinic keratosis: Secondary | ICD-10-CM | POA: Diagnosis not present

## 2015-03-17 DIAGNOSIS — C44629 Squamous cell carcinoma of skin of left upper limb, including shoulder: Secondary | ICD-10-CM | POA: Diagnosis not present

## 2015-03-17 DIAGNOSIS — L578 Other skin changes due to chronic exposure to nonionizing radiation: Secondary | ICD-10-CM | POA: Diagnosis not present

## 2015-03-17 DIAGNOSIS — C44311 Basal cell carcinoma of skin of nose: Secondary | ICD-10-CM | POA: Diagnosis not present

## 2015-03-29 DIAGNOSIS — I10 Essential (primary) hypertension: Secondary | ICD-10-CM | POA: Diagnosis not present

## 2015-03-29 DIAGNOSIS — K5900 Constipation, unspecified: Secondary | ICD-10-CM | POA: Diagnosis not present

## 2015-03-29 DIAGNOSIS — R1902 Left upper quadrant abdominal swelling, mass and lump: Secondary | ICD-10-CM | POA: Diagnosis not present

## 2015-04-06 DIAGNOSIS — R1012 Left upper quadrant pain: Secondary | ICD-10-CM | POA: Diagnosis not present

## 2015-04-06 DIAGNOSIS — R591 Generalized enlarged lymph nodes: Secondary | ICD-10-CM | POA: Diagnosis not present

## 2015-04-06 DIAGNOSIS — K449 Diaphragmatic hernia without obstruction or gangrene: Secondary | ICD-10-CM | POA: Diagnosis not present

## 2015-04-06 DIAGNOSIS — R1902 Left upper quadrant abdominal swelling, mass and lump: Secondary | ICD-10-CM | POA: Diagnosis not present

## 2015-04-13 DIAGNOSIS — R590 Localized enlarged lymph nodes: Secondary | ICD-10-CM | POA: Diagnosis not present

## 2015-08-20 DIAGNOSIS — I10 Essential (primary) hypertension: Secondary | ICD-10-CM | POA: Diagnosis not present

## 2015-08-20 DIAGNOSIS — H6120 Impacted cerumen, unspecified ear: Secondary | ICD-10-CM | POA: Diagnosis not present

## 2015-08-25 DIAGNOSIS — H6120 Impacted cerumen, unspecified ear: Secondary | ICD-10-CM | POA: Diagnosis not present

## 2016-02-16 DIAGNOSIS — I10 Essential (primary) hypertension: Secondary | ICD-10-CM | POA: Diagnosis not present

## 2016-02-16 DIAGNOSIS — Z1389 Encounter for screening for other disorder: Secondary | ICD-10-CM | POA: Diagnosis not present

## 2016-02-16 DIAGNOSIS — Z6826 Body mass index (BMI) 26.0-26.9, adult: Secondary | ICD-10-CM | POA: Diagnosis not present

## 2016-02-16 DIAGNOSIS — E785 Hyperlipidemia, unspecified: Secondary | ICD-10-CM | POA: Diagnosis not present

## 2016-02-16 DIAGNOSIS — R531 Weakness: Secondary | ICD-10-CM | POA: Diagnosis not present

## 2016-02-16 DIAGNOSIS — R29898 Other symptoms and signs involving the musculoskeletal system: Secondary | ICD-10-CM | POA: Diagnosis not present

## 2016-02-16 DIAGNOSIS — I4891 Unspecified atrial fibrillation: Secondary | ICD-10-CM | POA: Diagnosis not present

## 2016-02-21 DIAGNOSIS — Z1212 Encounter for screening for malignant neoplasm of rectum: Secondary | ICD-10-CM | POA: Diagnosis not present

## 2016-05-24 DIAGNOSIS — C44311 Basal cell carcinoma of skin of nose: Secondary | ICD-10-CM | POA: Diagnosis not present

## 2016-05-24 DIAGNOSIS — D2239 Melanocytic nevi of other parts of face: Secondary | ICD-10-CM | POA: Diagnosis not present

## 2016-05-24 DIAGNOSIS — L570 Actinic keratosis: Secondary | ICD-10-CM | POA: Diagnosis not present

## 2016-09-11 DIAGNOSIS — I10 Essential (primary) hypertension: Secondary | ICD-10-CM | POA: Diagnosis not present

## 2016-09-11 DIAGNOSIS — I482 Chronic atrial fibrillation: Secondary | ICD-10-CM | POA: Diagnosis not present

## 2016-09-11 DIAGNOSIS — H6121 Impacted cerumen, right ear: Secondary | ICD-10-CM | POA: Diagnosis not present

## 2016-09-11 DIAGNOSIS — E785 Hyperlipidemia, unspecified: Secondary | ICD-10-CM | POA: Diagnosis not present

## 2016-09-11 DIAGNOSIS — N183 Chronic kidney disease, stage 3 (moderate): Secondary | ICD-10-CM | POA: Diagnosis not present

## 2016-10-04 DIAGNOSIS — R05 Cough: Secondary | ICD-10-CM | POA: Diagnosis not present

## 2016-10-23 DIAGNOSIS — I482 Chronic atrial fibrillation: Secondary | ICD-10-CM | POA: Diagnosis not present

## 2016-10-23 DIAGNOSIS — I10 Essential (primary) hypertension: Secondary | ICD-10-CM | POA: Diagnosis not present

## 2016-12-19 DIAGNOSIS — D0439 Carcinoma in situ of skin of other parts of face: Secondary | ICD-10-CM | POA: Diagnosis not present

## 2017-02-22 DIAGNOSIS — H6123 Impacted cerumen, bilateral: Secondary | ICD-10-CM | POA: Diagnosis not present

## 2017-03-27 DIAGNOSIS — Z125 Encounter for screening for malignant neoplasm of prostate: Secondary | ICD-10-CM | POA: Diagnosis not present

## 2017-03-27 DIAGNOSIS — N189 Chronic kidney disease, unspecified: Secondary | ICD-10-CM | POA: Diagnosis not present

## 2017-03-27 DIAGNOSIS — Z6824 Body mass index (BMI) 24.0-24.9, adult: Secondary | ICD-10-CM | POA: Diagnosis not present

## 2017-03-27 DIAGNOSIS — I4891 Unspecified atrial fibrillation: Secondary | ICD-10-CM | POA: Diagnosis not present

## 2017-03-27 DIAGNOSIS — E785 Hyperlipidemia, unspecified: Secondary | ICD-10-CM | POA: Diagnosis not present

## 2017-03-27 DIAGNOSIS — I10 Essential (primary) hypertension: Secondary | ICD-10-CM | POA: Diagnosis not present

## 2017-03-27 DIAGNOSIS — Z0000 Encounter for general adult medical examination without abnormal findings: Secondary | ICD-10-CM | POA: Diagnosis not present

## 2017-03-27 DIAGNOSIS — Z1389 Encounter for screening for other disorder: Secondary | ICD-10-CM | POA: Diagnosis not present

## 2017-03-27 DIAGNOSIS — C8590 Non-Hodgkin lymphoma, unspecified, unspecified site: Secondary | ICD-10-CM | POA: Diagnosis not present

## 2017-07-10 DIAGNOSIS — R05 Cough: Secondary | ICD-10-CM | POA: Diagnosis not present

## 2017-07-10 DIAGNOSIS — J0110 Acute frontal sinusitis, unspecified: Secondary | ICD-10-CM | POA: Diagnosis not present

## 2017-07-10 DIAGNOSIS — H6122 Impacted cerumen, left ear: Secondary | ICD-10-CM | POA: Diagnosis not present

## 2017-07-31 DIAGNOSIS — C44519 Basal cell carcinoma of skin of other part of trunk: Secondary | ICD-10-CM | POA: Diagnosis not present

## 2017-07-31 DIAGNOSIS — C44529 Squamous cell carcinoma of skin of other part of trunk: Secondary | ICD-10-CM | POA: Diagnosis not present

## 2017-09-11 DIAGNOSIS — D649 Anemia, unspecified: Secondary | ICD-10-CM | POA: Diagnosis not present

## 2017-09-11 DIAGNOSIS — E785 Hyperlipidemia, unspecified: Secondary | ICD-10-CM | POA: Diagnosis not present

## 2017-09-11 DIAGNOSIS — I482 Chronic atrial fibrillation: Secondary | ICD-10-CM | POA: Diagnosis not present

## 2017-09-11 DIAGNOSIS — E876 Hypokalemia: Secondary | ICD-10-CM | POA: Diagnosis not present

## 2017-09-11 DIAGNOSIS — I10 Essential (primary) hypertension: Secondary | ICD-10-CM | POA: Diagnosis not present

## 2017-09-20 DIAGNOSIS — C44529 Squamous cell carcinoma of skin of other part of trunk: Secondary | ICD-10-CM | POA: Diagnosis not present

## 2017-11-05 DIAGNOSIS — I4891 Unspecified atrial fibrillation: Secondary | ICD-10-CM | POA: Diagnosis not present

## 2017-11-05 DIAGNOSIS — Z85828 Personal history of other malignant neoplasm of skin: Secondary | ICD-10-CM | POA: Diagnosis not present

## 2017-11-05 DIAGNOSIS — D649 Anemia, unspecified: Secondary | ICD-10-CM | POA: Diagnosis not present

## 2017-11-05 DIAGNOSIS — T8189XA Other complications of procedures, not elsewhere classified, initial encounter: Secondary | ICD-10-CM | POA: Diagnosis not present

## 2017-11-05 DIAGNOSIS — Z87891 Personal history of nicotine dependence: Secondary | ICD-10-CM | POA: Diagnosis not present

## 2017-11-05 DIAGNOSIS — Z8673 Personal history of transient ischemic attack (TIA), and cerebral infarction without residual deficits: Secondary | ICD-10-CM | POA: Diagnosis not present

## 2017-11-05 DIAGNOSIS — S21109A Unspecified open wound of unspecified front wall of thorax without penetration into thoracic cavity, initial encounter: Secondary | ICD-10-CM | POA: Diagnosis not present

## 2017-11-05 DIAGNOSIS — I10 Essential (primary) hypertension: Secondary | ICD-10-CM | POA: Diagnosis not present

## 2017-11-08 DIAGNOSIS — M1991 Primary osteoarthritis, unspecified site: Secondary | ICD-10-CM | POA: Diagnosis not present

## 2017-11-08 DIAGNOSIS — Z7901 Long term (current) use of anticoagulants: Secondary | ICD-10-CM | POA: Diagnosis not present

## 2017-11-08 DIAGNOSIS — I69334 Monoplegia of upper limb following cerebral infarction affecting left non-dominant side: Secondary | ICD-10-CM | POA: Diagnosis not present

## 2017-11-08 DIAGNOSIS — Z87891 Personal history of nicotine dependence: Secondary | ICD-10-CM | POA: Diagnosis not present

## 2017-11-08 DIAGNOSIS — D649 Anemia, unspecified: Secondary | ICD-10-CM | POA: Diagnosis not present

## 2017-11-08 DIAGNOSIS — Z9181 History of falling: Secondary | ICD-10-CM | POA: Diagnosis not present

## 2017-11-08 DIAGNOSIS — I10 Essential (primary) hypertension: Secondary | ICD-10-CM | POA: Diagnosis not present

## 2017-11-08 DIAGNOSIS — T8189XD Other complications of procedures, not elsewhere classified, subsequent encounter: Secondary | ICD-10-CM | POA: Diagnosis not present

## 2017-11-08 DIAGNOSIS — I4891 Unspecified atrial fibrillation: Secondary | ICD-10-CM | POA: Diagnosis not present

## 2017-11-08 DIAGNOSIS — Z85828 Personal history of other malignant neoplasm of skin: Secondary | ICD-10-CM | POA: Diagnosis not present

## 2017-11-12 DIAGNOSIS — I10 Essential (primary) hypertension: Secondary | ICD-10-CM | POA: Diagnosis not present

## 2017-11-12 DIAGNOSIS — I69334 Monoplegia of upper limb following cerebral infarction affecting left non-dominant side: Secondary | ICD-10-CM | POA: Diagnosis not present

## 2017-11-12 DIAGNOSIS — T8189XD Other complications of procedures, not elsewhere classified, subsequent encounter: Secondary | ICD-10-CM | POA: Diagnosis not present

## 2017-11-12 DIAGNOSIS — I4891 Unspecified atrial fibrillation: Secondary | ICD-10-CM | POA: Diagnosis not present

## 2017-11-12 DIAGNOSIS — M1991 Primary osteoarthritis, unspecified site: Secondary | ICD-10-CM | POA: Diagnosis not present

## 2017-11-12 DIAGNOSIS — D649 Anemia, unspecified: Secondary | ICD-10-CM | POA: Diagnosis not present

## 2017-11-14 DIAGNOSIS — S21109A Unspecified open wound of unspecified front wall of thorax without penetration into thoracic cavity, initial encounter: Secondary | ICD-10-CM | POA: Diagnosis not present

## 2017-11-14 DIAGNOSIS — I10 Essential (primary) hypertension: Secondary | ICD-10-CM | POA: Diagnosis not present

## 2017-11-14 DIAGNOSIS — T8189XA Other complications of procedures, not elsewhere classified, initial encounter: Secondary | ICD-10-CM | POA: Diagnosis not present

## 2017-11-16 DIAGNOSIS — I10 Essential (primary) hypertension: Secondary | ICD-10-CM | POA: Diagnosis not present

## 2017-11-16 DIAGNOSIS — M1991 Primary osteoarthritis, unspecified site: Secondary | ICD-10-CM | POA: Diagnosis not present

## 2017-11-16 DIAGNOSIS — D649 Anemia, unspecified: Secondary | ICD-10-CM | POA: Diagnosis not present

## 2017-11-16 DIAGNOSIS — T8189XD Other complications of procedures, not elsewhere classified, subsequent encounter: Secondary | ICD-10-CM | POA: Diagnosis not present

## 2017-11-16 DIAGNOSIS — I4891 Unspecified atrial fibrillation: Secondary | ICD-10-CM | POA: Diagnosis not present

## 2017-11-16 DIAGNOSIS — I69334 Monoplegia of upper limb following cerebral infarction affecting left non-dominant side: Secondary | ICD-10-CM | POA: Diagnosis not present

## 2017-11-19 DIAGNOSIS — D649 Anemia, unspecified: Secondary | ICD-10-CM | POA: Diagnosis not present

## 2017-11-19 DIAGNOSIS — I69334 Monoplegia of upper limb following cerebral infarction affecting left non-dominant side: Secondary | ICD-10-CM | POA: Diagnosis not present

## 2017-11-19 DIAGNOSIS — I10 Essential (primary) hypertension: Secondary | ICD-10-CM | POA: Diagnosis not present

## 2017-11-19 DIAGNOSIS — I4891 Unspecified atrial fibrillation: Secondary | ICD-10-CM | POA: Diagnosis not present

## 2017-11-19 DIAGNOSIS — T8189XD Other complications of procedures, not elsewhere classified, subsequent encounter: Secondary | ICD-10-CM | POA: Diagnosis not present

## 2017-11-19 DIAGNOSIS — M1991 Primary osteoarthritis, unspecified site: Secondary | ICD-10-CM | POA: Diagnosis not present

## 2017-11-21 DIAGNOSIS — T8189XA Other complications of procedures, not elsewhere classified, initial encounter: Secondary | ICD-10-CM | POA: Diagnosis not present

## 2017-11-21 DIAGNOSIS — I10 Essential (primary) hypertension: Secondary | ICD-10-CM | POA: Diagnosis not present

## 2017-11-21 DIAGNOSIS — S21109A Unspecified open wound of unspecified front wall of thorax without penetration into thoracic cavity, initial encounter: Secondary | ICD-10-CM | POA: Diagnosis not present

## 2017-11-23 DIAGNOSIS — D649 Anemia, unspecified: Secondary | ICD-10-CM | POA: Diagnosis not present

## 2017-11-23 DIAGNOSIS — I10 Essential (primary) hypertension: Secondary | ICD-10-CM | POA: Diagnosis not present

## 2017-11-23 DIAGNOSIS — T8189XD Other complications of procedures, not elsewhere classified, subsequent encounter: Secondary | ICD-10-CM | POA: Diagnosis not present

## 2017-11-23 DIAGNOSIS — I4891 Unspecified atrial fibrillation: Secondary | ICD-10-CM | POA: Diagnosis not present

## 2017-11-23 DIAGNOSIS — M1991 Primary osteoarthritis, unspecified site: Secondary | ICD-10-CM | POA: Diagnosis not present

## 2017-11-23 DIAGNOSIS — I69334 Monoplegia of upper limb following cerebral infarction affecting left non-dominant side: Secondary | ICD-10-CM | POA: Diagnosis not present

## 2017-11-27 DIAGNOSIS — S21109A Unspecified open wound of unspecified front wall of thorax without penetration into thoracic cavity, initial encounter: Secondary | ICD-10-CM | POA: Diagnosis not present

## 2017-11-27 DIAGNOSIS — T8189XA Other complications of procedures, not elsewhere classified, initial encounter: Secondary | ICD-10-CM | POA: Diagnosis not present

## 2017-11-30 DIAGNOSIS — T8189XD Other complications of procedures, not elsewhere classified, subsequent encounter: Secondary | ICD-10-CM | POA: Diagnosis not present

## 2017-11-30 DIAGNOSIS — I69334 Monoplegia of upper limb following cerebral infarction affecting left non-dominant side: Secondary | ICD-10-CM | POA: Diagnosis not present

## 2017-11-30 DIAGNOSIS — D649 Anemia, unspecified: Secondary | ICD-10-CM | POA: Diagnosis not present

## 2017-11-30 DIAGNOSIS — I4891 Unspecified atrial fibrillation: Secondary | ICD-10-CM | POA: Diagnosis not present

## 2017-11-30 DIAGNOSIS — I10 Essential (primary) hypertension: Secondary | ICD-10-CM | POA: Diagnosis not present

## 2017-11-30 DIAGNOSIS — M1991 Primary osteoarthritis, unspecified site: Secondary | ICD-10-CM | POA: Diagnosis not present

## 2017-12-04 DIAGNOSIS — Z09 Encounter for follow-up examination after completed treatment for conditions other than malignant neoplasm: Secondary | ICD-10-CM | POA: Diagnosis not present

## 2017-12-04 DIAGNOSIS — T8189XA Other complications of procedures, not elsewhere classified, initial encounter: Secondary | ICD-10-CM | POA: Diagnosis not present

## 2017-12-04 DIAGNOSIS — Z87828 Personal history of other (healed) physical injury and trauma: Secondary | ICD-10-CM | POA: Diagnosis not present

## 2017-12-20 DIAGNOSIS — H26493 Other secondary cataract, bilateral: Secondary | ICD-10-CM | POA: Diagnosis not present

## 2017-12-21 ENCOUNTER — Other Ambulatory Visit: Payer: Self-pay

## 2018-01-29 DIAGNOSIS — C4442 Squamous cell carcinoma of skin of scalp and neck: Secondary | ICD-10-CM | POA: Diagnosis not present

## 2018-01-29 DIAGNOSIS — L821 Other seborrheic keratosis: Secondary | ICD-10-CM | POA: Diagnosis not present

## 2018-01-29 DIAGNOSIS — C44529 Squamous cell carcinoma of skin of other part of trunk: Secondary | ICD-10-CM | POA: Diagnosis not present

## 2018-01-29 DIAGNOSIS — L570 Actinic keratosis: Secondary | ICD-10-CM | POA: Diagnosis not present

## 2018-01-31 DIAGNOSIS — C4442 Squamous cell carcinoma of skin of scalp and neck: Secondary | ICD-10-CM | POA: Diagnosis not present

## 2018-02-01 DIAGNOSIS — D0439 Carcinoma in situ of skin of other parts of face: Secondary | ICD-10-CM | POA: Diagnosis not present

## 2018-03-12 DIAGNOSIS — I482 Chronic atrial fibrillation, unspecified: Secondary | ICD-10-CM | POA: Diagnosis not present

## 2018-03-12 DIAGNOSIS — Z1389 Encounter for screening for other disorder: Secondary | ICD-10-CM | POA: Diagnosis not present

## 2018-03-12 DIAGNOSIS — D649 Anemia, unspecified: Secondary | ICD-10-CM | POA: Diagnosis not present

## 2018-03-12 DIAGNOSIS — E7800 Pure hypercholesterolemia, unspecified: Secondary | ICD-10-CM | POA: Diagnosis not present

## 2018-03-12 DIAGNOSIS — I10 Essential (primary) hypertension: Secondary | ICD-10-CM | POA: Diagnosis not present

## 2018-04-09 DIAGNOSIS — Z8673 Personal history of transient ischemic attack (TIA), and cerebral infarction without residual deficits: Secondary | ICD-10-CM | POA: Diagnosis not present

## 2018-04-09 DIAGNOSIS — I4891 Unspecified atrial fibrillation: Secondary | ICD-10-CM | POA: Diagnosis not present

## 2018-04-09 DIAGNOSIS — D649 Anemia, unspecified: Secondary | ICD-10-CM | POA: Diagnosis not present

## 2018-04-09 DIAGNOSIS — Z85828 Personal history of other malignant neoplasm of skin: Secondary | ICD-10-CM | POA: Diagnosis not present

## 2018-04-09 DIAGNOSIS — T8189XA Other complications of procedures, not elsewhere classified, initial encounter: Secondary | ICD-10-CM | POA: Diagnosis not present

## 2018-04-09 DIAGNOSIS — Z87891 Personal history of nicotine dependence: Secondary | ICD-10-CM | POA: Diagnosis not present

## 2018-04-09 DIAGNOSIS — I10 Essential (primary) hypertension: Secondary | ICD-10-CM | POA: Diagnosis not present

## 2018-04-09 DIAGNOSIS — S0100XA Unspecified open wound of scalp, initial encounter: Secondary | ICD-10-CM | POA: Diagnosis not present

## 2018-04-12 DIAGNOSIS — S0100XA Unspecified open wound of scalp, initial encounter: Secondary | ICD-10-CM | POA: Diagnosis not present

## 2018-04-12 DIAGNOSIS — T8189XA Other complications of procedures, not elsewhere classified, initial encounter: Secondary | ICD-10-CM | POA: Diagnosis not present

## 2018-04-13 DIAGNOSIS — S0190XA Unspecified open wound of unspecified part of head, initial encounter: Secondary | ICD-10-CM | POA: Diagnosis not present

## 2018-04-17 DIAGNOSIS — T8189XA Other complications of procedures, not elsewhere classified, initial encounter: Secondary | ICD-10-CM | POA: Diagnosis not present

## 2018-04-17 DIAGNOSIS — S0100XA Unspecified open wound of scalp, initial encounter: Secondary | ICD-10-CM | POA: Diagnosis not present

## 2018-04-20 DIAGNOSIS — Z4800 Encounter for change or removal of nonsurgical wound dressing: Secondary | ICD-10-CM | POA: Diagnosis not present

## 2018-04-22 DIAGNOSIS — S0100XA Unspecified open wound of scalp, initial encounter: Secondary | ICD-10-CM | POA: Diagnosis not present

## 2018-04-24 DIAGNOSIS — S0100XA Unspecified open wound of scalp, initial encounter: Secondary | ICD-10-CM | POA: Diagnosis not present

## 2018-04-25 DIAGNOSIS — S0100XA Unspecified open wound of scalp, initial encounter: Secondary | ICD-10-CM | POA: Diagnosis not present

## 2018-04-25 DIAGNOSIS — T8189XA Other complications of procedures, not elsewhere classified, initial encounter: Secondary | ICD-10-CM | POA: Diagnosis not present

## 2018-04-26 DIAGNOSIS — I4891 Unspecified atrial fibrillation: Secondary | ICD-10-CM | POA: Diagnosis not present

## 2018-04-26 DIAGNOSIS — D649 Anemia, unspecified: Secondary | ICD-10-CM | POA: Diagnosis not present

## 2018-04-26 DIAGNOSIS — C4442 Squamous cell carcinoma of skin of scalp and neck: Secondary | ICD-10-CM | POA: Diagnosis not present

## 2018-04-26 DIAGNOSIS — I10 Essential (primary) hypertension: Secondary | ICD-10-CM | POA: Diagnosis not present

## 2018-04-26 DIAGNOSIS — Z7901 Long term (current) use of anticoagulants: Secondary | ICD-10-CM | POA: Diagnosis not present

## 2018-04-26 DIAGNOSIS — Z602 Problems related to living alone: Secondary | ICD-10-CM | POA: Diagnosis not present

## 2018-04-26 DIAGNOSIS — Z483 Aftercare following surgery for neoplasm: Secondary | ICD-10-CM | POA: Diagnosis not present

## 2018-04-26 DIAGNOSIS — I69334 Monoplegia of upper limb following cerebral infarction affecting left non-dominant side: Secondary | ICD-10-CM | POA: Diagnosis not present

## 2018-04-26 DIAGNOSIS — Z87891 Personal history of nicotine dependence: Secondary | ICD-10-CM | POA: Diagnosis not present

## 2018-04-26 DIAGNOSIS — M1991 Primary osteoarthritis, unspecified site: Secondary | ICD-10-CM | POA: Diagnosis not present

## 2018-04-26 DIAGNOSIS — Z791 Long term (current) use of non-steroidal anti-inflammatories (NSAID): Secondary | ICD-10-CM | POA: Diagnosis not present

## 2018-04-26 DIAGNOSIS — Z9181 History of falling: Secondary | ICD-10-CM | POA: Diagnosis not present

## 2018-04-29 DIAGNOSIS — C4442 Squamous cell carcinoma of skin of scalp and neck: Secondary | ICD-10-CM | POA: Diagnosis not present

## 2018-04-29 DIAGNOSIS — I10 Essential (primary) hypertension: Secondary | ICD-10-CM | POA: Diagnosis not present

## 2018-04-29 DIAGNOSIS — I69334 Monoplegia of upper limb following cerebral infarction affecting left non-dominant side: Secondary | ICD-10-CM | POA: Diagnosis not present

## 2018-04-29 DIAGNOSIS — D649 Anemia, unspecified: Secondary | ICD-10-CM | POA: Diagnosis not present

## 2018-04-29 DIAGNOSIS — I4891 Unspecified atrial fibrillation: Secondary | ICD-10-CM | POA: Diagnosis not present

## 2018-04-29 DIAGNOSIS — Z483 Aftercare following surgery for neoplasm: Secondary | ICD-10-CM | POA: Diagnosis not present

## 2018-05-01 DIAGNOSIS — S0100XA Unspecified open wound of scalp, initial encounter: Secondary | ICD-10-CM | POA: Diagnosis not present

## 2018-05-03 DIAGNOSIS — D649 Anemia, unspecified: Secondary | ICD-10-CM | POA: Diagnosis not present

## 2018-05-03 DIAGNOSIS — I4891 Unspecified atrial fibrillation: Secondary | ICD-10-CM | POA: Diagnosis not present

## 2018-05-03 DIAGNOSIS — C4442 Squamous cell carcinoma of skin of scalp and neck: Secondary | ICD-10-CM | POA: Diagnosis not present

## 2018-05-03 DIAGNOSIS — I69334 Monoplegia of upper limb following cerebral infarction affecting left non-dominant side: Secondary | ICD-10-CM | POA: Diagnosis not present

## 2018-05-03 DIAGNOSIS — I10 Essential (primary) hypertension: Secondary | ICD-10-CM | POA: Diagnosis not present

## 2018-05-03 DIAGNOSIS — Z483 Aftercare following surgery for neoplasm: Secondary | ICD-10-CM | POA: Diagnosis not present

## 2018-05-06 ENCOUNTER — Ambulatory Visit (INDEPENDENT_AMBULATORY_CARE_PROVIDER_SITE_OTHER): Payer: Medicare Other | Admitting: Cardiology

## 2018-05-06 ENCOUNTER — Encounter: Payer: Self-pay | Admitting: Cardiology

## 2018-05-06 DIAGNOSIS — I6930 Unspecified sequelae of cerebral infarction: Secondary | ICD-10-CM

## 2018-05-06 DIAGNOSIS — Z01810 Encounter for preprocedural cardiovascular examination: Secondary | ICD-10-CM | POA: Insufficient documentation

## 2018-05-06 DIAGNOSIS — Z483 Aftercare following surgery for neoplasm: Secondary | ICD-10-CM | POA: Diagnosis not present

## 2018-05-06 DIAGNOSIS — D649 Anemia, unspecified: Secondary | ICD-10-CM | POA: Diagnosis not present

## 2018-05-06 DIAGNOSIS — R0609 Other forms of dyspnea: Secondary | ICD-10-CM | POA: Diagnosis not present

## 2018-05-06 DIAGNOSIS — I69334 Monoplegia of upper limb following cerebral infarction affecting left non-dominant side: Secondary | ICD-10-CM | POA: Diagnosis not present

## 2018-05-06 DIAGNOSIS — I4821 Permanent atrial fibrillation: Secondary | ICD-10-CM | POA: Diagnosis not present

## 2018-05-06 DIAGNOSIS — C4442 Squamous cell carcinoma of skin of scalp and neck: Secondary | ICD-10-CM | POA: Diagnosis not present

## 2018-05-06 DIAGNOSIS — I10 Essential (primary) hypertension: Secondary | ICD-10-CM | POA: Diagnosis not present

## 2018-05-06 DIAGNOSIS — I4891 Unspecified atrial fibrillation: Secondary | ICD-10-CM | POA: Diagnosis not present

## 2018-05-06 NOTE — Progress Notes (Signed)
Cardiology Consultation:    Date:  05/06/2018   ID:  Craig Hawthorne., DOB 1927-12-28, MRN [id number]  PCP:  Garwin Brothers, MD  Cardiologist:  Jenne Campus, MD   Referring MD: Governor Rooks., DO   Chief Complaint  Patient presents with  . Pre-op Exam  I need to have surgery on my head  History of Present Illness:    Craig Stay. is a 84 y.o. male who is being seen today for the evaluation of cardiovascular preop evaluation at the request of Governor Rooks., DO.  He is scheduled to have debridement of the wound on his head after his squamous cell was removed.  That will be done under general anesthesia he was sent to [place] for evaluation before the surgery he denies having any chest pain tightness squeezing pressure burning chest.  Few years ago he ended up having stroke he was find to be in atrial fibrillation he was put on Eliquis that he has been continue no problems since that time.  He said he is fairly active but much less than it used to be he gets short of breath quite easily but no chest pain tightness squeezing pressure been chest never had any heart trouble except for atrial fibrillation.  He does have hypertension denies having any diabetes does have some dyslipidemia.  No past medical history on file.    Current Medications: Current Meds  Medication Sig  . apixaban (ELIQUIS) 2.5 MG TABS tablet Take 1 tablet by mouth 2 (two) times daily.  Marland Kitchen atorvastatin (LIPITOR) 20 MG tablet Take 1 tablet by mouth daily.  Marland Kitchen diltiazem (CARDIZEM CD) 240 MG 24 hr capsule Take 1 capsule by mouth daily.   . hydrochlorothiazide (HYDRODIURIL) 25 MG tablet Take 1 tablet by mouth daily.  Marland Kitchen lactulose (CEPHULAC) 20 g packet Take 1 packet by mouth as needed.      Allergies:   Codeine   Social History   Socioeconomic History  . Marital status: Widowed    Spouse name: Not on file  . Number of children: Not on file  . Years of education: Not on file  . Highest education level: Not on  file  Occupational History  . Not on file  Social Needs  . Financial resource strain: Not on file  . Food insecurity:    Worry: Not on file    Inability: Not on file  . Transportation needs:    Medical: Not on file    Non-medical: Not on file  Tobacco Use  . Smoking status: Former Smoker    Types: Cigarettes  . Smokeless tobacco: Never Used  Substance and Sexual Activity  . Alcohol use: Not Currently  . Drug use: Never  . Sexual activity: Not on file  Lifestyle  . Physical activity:    Days per week: Not on file    Minutes per session: Not on file  . Stress: Not on file  Relationships  . Social connections:    Talks on phone: Not on file    Gets together: Not on file    Attends religious service: Not on file    Active member of club or organization: Not on file    Attends meetings of clubs or organizations: Not on file    Relationship status: Not on file  Other Topics Concern  . Not on file  Social History Narrative  . Not on file     Family History: The patient's family history includes Cerebral aneurysm in  his mother; Heart attack in his father. ROS:   Please see the history of present illness.    All 14 point review of systems negative except as described per history of present illness.  EKGs/Labs/Other Studies Reviewed:    The following studies were reviewed today:     Recent Labs: No results found for requested labs within last 8760 hours.  Recent Lipid Panel No results found for: CHOL, TRIG, HDL, CHOLHDL, VLDL, LDLCALC, LDLDIRECT  Physical Exam:    VS:  BP 130/62   Pulse 76   Ht 6\' 1"  (1.854 m)   Wt 188 lb 9.6 oz (85.5 kg)   SpO2 98%   BMI 24.88 kg/m     Wt Readings from Last 3 Encounters:  05/06/18 188 lb 9.6 oz (85.5 kg)     GEN:  Well nourished, well developed in no acute distress HEENT: Normal NECK: No JVD; No carotid bruits LYMPHATICS: No lymphadenopathy CARDIAC: irreg irreg, no murmurs, no rubs, no gallops RESPIRATORY:  Clear to  auscultation without rales, wheezing or rhonchi  ABDOMEN: Soft, non-tender, non-distended MUSCULOSKELETAL:  No edema; No deformity  SKIN: Warm and dry NEUROLOGIC:  Alert and oriented x 3 PSYCHIATRIC:  Normal affect   ASSESSMENT:    1. Permanent atrial fibrillation   2. Late effect of cerebrovascular accident (CVA)   3. Preop cardiovascular exam   4. Dyspnea on exertion    PLAN:    In order of problems listed above:  1. Permanent atrial fibrillation.  EKG will be done to confirm that.  He is on anticoagulation which I will continue.  Rate appears to be controlled.  Echocardiogram will be done to assess left atrial size and left ventricular ejection fraction. 2. Late effect of CVA.  Stable on anticoagulation which I will continue. 3. Preop cardiovascular evaluation.  I will ask him to have stress test done it will be done form of Lexiscan to make sure there is no inducible ischemia he does have risk factors for coronary artery disease he does not have any typical symptoms but his ability to exercise is limited.  Surgery cannot be done under general anesthesia and that need to be clarified if he does have significant coronary artery disease or not. 4. Dyspnea on exertion echocardiogram and stress test will be done   Medication Adjustments/Labs and Tests Ordered: Current medicines are reviewed at length with the patient today.  Concerns regarding medicines are outlined above.  No orders of the defined types were placed in this encounter.  No orders of the defined types were placed in this encounter.   Signed, Park Liter, MD, [facility]. 05/06/2018 11:10 AM    [place]

## 2018-05-06 NOTE — Patient Instructions (Signed)
Medication Instructions:  Your physician recommends that you continue on your current medications as directed. Please refer to the Current Medication list given to you today.  If you need a refill on your cardiac medications before your next appointment, please call your pharmacy.   Lab work: None  If you have labs (blood work) drawn today and your tests are completely normal, you will receive your results only by: Marland Kitchen MyChart Message (if you have MyChart) OR . A paper copy in the mail If you have any lab test that is abnormal or we need to change your treatment, we will call you to review the results.  Testing/Procedures: Your physician has requested that you have an echocardiogram. Echocardiography is a painless test that uses sound waves to create images of your heart. It provides your doctor with information about the size and shape of your heart and how well your heart's chambers and valves are working. This procedure takes approximately one hour. There are no restrictions for this procedure.  Your physician has requested that you have a lexiscan myoview. For further information please visit [email or web address]. Please follow instruction sheet, as given.  Stress Test Directions for [facility]:  1.) Please check in at the outpatient center at [facility] the day of your testing. 2.) Nothing to eat or drink after midnight prior to testing. You may take your medications that morning with water except the metformin.   3.) Please be aware that the test can take up to 3-4 hours.  4.) Should you have any problem with the appointment date or time, please call [phone].   Follow-Up: At [facility], you and your health needs are our priority.  As part of our continuing mission to provide you with exceptional heart care, we have created designated Provider Care Teams.  These Care Teams include your primary Cardiologist (physician) and Advanced Practice Providers (APPs -   Physician Assistants and Nurse Practitioners) who all work together to provide you with the care you need, when you need it.  You will need a follow up appointment in 4 weeks.  Please call our office 2 months in advance to schedule this appointment.  You may see another member of our [employer] Provider Team in [place]: Shirlee More, MD . Jyl Heinz, MD  Any Other Special Instructions Will Be Listed Below (If Applicable).

## 2018-05-08 DIAGNOSIS — I69334 Monoplegia of upper limb following cerebral infarction affecting left non-dominant side: Secondary | ICD-10-CM | POA: Diagnosis not present

## 2018-05-08 DIAGNOSIS — I10 Essential (primary) hypertension: Secondary | ICD-10-CM | POA: Diagnosis not present

## 2018-05-08 DIAGNOSIS — D649 Anemia, unspecified: Secondary | ICD-10-CM | POA: Diagnosis not present

## 2018-05-08 DIAGNOSIS — Z483 Aftercare following surgery for neoplasm: Secondary | ICD-10-CM | POA: Diagnosis not present

## 2018-05-08 DIAGNOSIS — I4891 Unspecified atrial fibrillation: Secondary | ICD-10-CM | POA: Diagnosis not present

## 2018-05-08 DIAGNOSIS — C4442 Squamous cell carcinoma of skin of scalp and neck: Secondary | ICD-10-CM | POA: Diagnosis not present

## 2018-05-10 DIAGNOSIS — R0602 Shortness of breath: Secondary | ICD-10-CM | POA: Diagnosis not present

## 2018-05-10 DIAGNOSIS — R0609 Other forms of dyspnea: Secondary | ICD-10-CM | POA: Diagnosis not present

## 2018-05-10 DIAGNOSIS — C4442 Squamous cell carcinoma of skin of scalp and neck: Secondary | ICD-10-CM | POA: Diagnosis not present

## 2018-05-10 DIAGNOSIS — D649 Anemia, unspecified: Secondary | ICD-10-CM | POA: Diagnosis not present

## 2018-05-10 DIAGNOSIS — I340 Nonrheumatic mitral (valve) insufficiency: Secondary | ICD-10-CM | POA: Diagnosis not present

## 2018-05-10 DIAGNOSIS — Z483 Aftercare following surgery for neoplasm: Secondary | ICD-10-CM | POA: Diagnosis not present

## 2018-05-10 DIAGNOSIS — I69334 Monoplegia of upper limb following cerebral infarction affecting left non-dominant side: Secondary | ICD-10-CM | POA: Diagnosis not present

## 2018-05-10 DIAGNOSIS — I10 Essential (primary) hypertension: Secondary | ICD-10-CM | POA: Diagnosis not present

## 2018-05-10 DIAGNOSIS — I4891 Unspecified atrial fibrillation: Secondary | ICD-10-CM | POA: Diagnosis not present

## 2018-05-10 DIAGNOSIS — Z01810 Encounter for preprocedural cardiovascular examination: Secondary | ICD-10-CM | POA: Diagnosis not present

## 2018-05-10 DIAGNOSIS — R079 Chest pain, unspecified: Secondary | ICD-10-CM | POA: Diagnosis not present

## 2018-05-13 DIAGNOSIS — D649 Anemia, unspecified: Secondary | ICD-10-CM | POA: Diagnosis not present

## 2018-05-13 DIAGNOSIS — Z483 Aftercare following surgery for neoplasm: Secondary | ICD-10-CM | POA: Diagnosis not present

## 2018-05-13 DIAGNOSIS — I10 Essential (primary) hypertension: Secondary | ICD-10-CM | POA: Diagnosis not present

## 2018-05-13 DIAGNOSIS — I4891 Unspecified atrial fibrillation: Secondary | ICD-10-CM | POA: Diagnosis not present

## 2018-05-13 DIAGNOSIS — I69334 Monoplegia of upper limb following cerebral infarction affecting left non-dominant side: Secondary | ICD-10-CM | POA: Diagnosis not present

## 2018-05-13 DIAGNOSIS — C4442 Squamous cell carcinoma of skin of scalp and neck: Secondary | ICD-10-CM | POA: Diagnosis not present

## 2018-05-17 DIAGNOSIS — Z6824 Body mass index (BMI) 24.0-24.9, adult: Secondary | ICD-10-CM | POA: Diagnosis not present

## 2018-05-17 DIAGNOSIS — I4891 Unspecified atrial fibrillation: Secondary | ICD-10-CM | POA: Diagnosis not present

## 2018-05-17 DIAGNOSIS — Z1389 Encounter for screening for other disorder: Secondary | ICD-10-CM | POA: Diagnosis not present

## 2018-05-17 DIAGNOSIS — Z483 Aftercare following surgery for neoplasm: Secondary | ICD-10-CM | POA: Diagnosis not present

## 2018-05-17 DIAGNOSIS — I10 Essential (primary) hypertension: Secondary | ICD-10-CM | POA: Diagnosis not present

## 2018-05-17 DIAGNOSIS — E782 Mixed hyperlipidemia: Secondary | ICD-10-CM | POA: Diagnosis not present

## 2018-05-17 DIAGNOSIS — I69334 Monoplegia of upper limb following cerebral infarction affecting left non-dominant side: Secondary | ICD-10-CM | POA: Diagnosis not present

## 2018-05-17 DIAGNOSIS — D649 Anemia, unspecified: Secondary | ICD-10-CM | POA: Diagnosis not present

## 2018-05-17 DIAGNOSIS — C4442 Squamous cell carcinoma of skin of scalp and neck: Secondary | ICD-10-CM | POA: Diagnosis not present

## 2018-05-20 DIAGNOSIS — I69334 Monoplegia of upper limb following cerebral infarction affecting left non-dominant side: Secondary | ICD-10-CM | POA: Diagnosis not present

## 2018-05-20 DIAGNOSIS — Z483 Aftercare following surgery for neoplasm: Secondary | ICD-10-CM | POA: Diagnosis not present

## 2018-05-20 DIAGNOSIS — I10 Essential (primary) hypertension: Secondary | ICD-10-CM | POA: Diagnosis not present

## 2018-05-20 DIAGNOSIS — I4891 Unspecified atrial fibrillation: Secondary | ICD-10-CM | POA: Diagnosis not present

## 2018-05-20 DIAGNOSIS — C4442 Squamous cell carcinoma of skin of scalp and neck: Secondary | ICD-10-CM | POA: Diagnosis not present

## 2018-05-20 DIAGNOSIS — D649 Anemia, unspecified: Secondary | ICD-10-CM | POA: Diagnosis not present

## 2018-05-21 ENCOUNTER — Ambulatory Visit: Payer: Medicare Other | Admitting: Cardiology

## 2018-05-23 DIAGNOSIS — D649 Anemia, unspecified: Secondary | ICD-10-CM | POA: Diagnosis not present

## 2018-05-23 DIAGNOSIS — Z483 Aftercare following surgery for neoplasm: Secondary | ICD-10-CM | POA: Diagnosis not present

## 2018-05-23 DIAGNOSIS — C4442 Squamous cell carcinoma of skin of scalp and neck: Secondary | ICD-10-CM | POA: Diagnosis not present

## 2018-05-23 DIAGNOSIS — I4891 Unspecified atrial fibrillation: Secondary | ICD-10-CM | POA: Diagnosis not present

## 2018-05-23 DIAGNOSIS — I69334 Monoplegia of upper limb following cerebral infarction affecting left non-dominant side: Secondary | ICD-10-CM | POA: Diagnosis not present

## 2018-05-23 DIAGNOSIS — I10 Essential (primary) hypertension: Secondary | ICD-10-CM | POA: Diagnosis not present

## 2018-05-25 DIAGNOSIS — C4442 Squamous cell carcinoma of skin of scalp and neck: Secondary | ICD-10-CM | POA: Diagnosis not present

## 2018-05-25 DIAGNOSIS — I4891 Unspecified atrial fibrillation: Secondary | ICD-10-CM | POA: Diagnosis not present

## 2018-05-25 DIAGNOSIS — Z483 Aftercare following surgery for neoplasm: Secondary | ICD-10-CM | POA: Diagnosis not present

## 2018-05-25 DIAGNOSIS — D649 Anemia, unspecified: Secondary | ICD-10-CM | POA: Diagnosis not present

## 2018-05-25 DIAGNOSIS — I10 Essential (primary) hypertension: Secondary | ICD-10-CM | POA: Diagnosis not present

## 2018-05-25 DIAGNOSIS — I69334 Monoplegia of upper limb following cerebral infarction affecting left non-dominant side: Secondary | ICD-10-CM | POA: Diagnosis not present

## 2018-05-27 DIAGNOSIS — I69354 Hemiplegia and hemiparesis following cerebral infarction affecting left non-dominant side: Secondary | ICD-10-CM | POA: Diagnosis not present

## 2018-05-27 DIAGNOSIS — I2510 Atherosclerotic heart disease of native coronary artery without angina pectoris: Secondary | ICD-10-CM | POA: Diagnosis not present

## 2018-05-27 DIAGNOSIS — F17210 Nicotine dependence, cigarettes, uncomplicated: Secondary | ICD-10-CM | POA: Diagnosis not present

## 2018-05-27 DIAGNOSIS — Z87891 Personal history of nicotine dependence: Secondary | ICD-10-CM | POA: Diagnosis not present

## 2018-05-27 DIAGNOSIS — I4821 Permanent atrial fibrillation: Secondary | ICD-10-CM | POA: Diagnosis not present

## 2018-05-27 DIAGNOSIS — Z7901 Long term (current) use of anticoagulants: Secondary | ICD-10-CM | POA: Diagnosis not present

## 2018-05-27 DIAGNOSIS — L980 Pyogenic granuloma: Secondary | ICD-10-CM | POA: Diagnosis not present

## 2018-05-27 DIAGNOSIS — I10 Essential (primary) hypertension: Secondary | ICD-10-CM | POA: Diagnosis not present

## 2018-05-27 DIAGNOSIS — C4442 Squamous cell carcinoma of skin of scalp and neck: Secondary | ICD-10-CM | POA: Diagnosis not present

## 2018-05-27 DIAGNOSIS — Z79899 Other long term (current) drug therapy: Secondary | ICD-10-CM | POA: Diagnosis not present

## 2018-05-31 DIAGNOSIS — I69334 Monoplegia of upper limb following cerebral infarction affecting left non-dominant side: Secondary | ICD-10-CM | POA: Diagnosis not present

## 2018-05-31 DIAGNOSIS — I10 Essential (primary) hypertension: Secondary | ICD-10-CM | POA: Diagnosis not present

## 2018-05-31 DIAGNOSIS — C4442 Squamous cell carcinoma of skin of scalp and neck: Secondary | ICD-10-CM | POA: Diagnosis not present

## 2018-05-31 DIAGNOSIS — I4891 Unspecified atrial fibrillation: Secondary | ICD-10-CM | POA: Diagnosis not present

## 2018-05-31 DIAGNOSIS — D649 Anemia, unspecified: Secondary | ICD-10-CM | POA: Diagnosis not present

## 2018-05-31 DIAGNOSIS — Z483 Aftercare following surgery for neoplasm: Secondary | ICD-10-CM | POA: Diagnosis not present

## 2018-06-02 DIAGNOSIS — D649 Anemia, unspecified: Secondary | ICD-10-CM | POA: Diagnosis not present

## 2018-06-02 DIAGNOSIS — I10 Essential (primary) hypertension: Secondary | ICD-10-CM | POA: Diagnosis not present

## 2018-06-02 DIAGNOSIS — C4442 Squamous cell carcinoma of skin of scalp and neck: Secondary | ICD-10-CM | POA: Diagnosis not present

## 2018-06-02 DIAGNOSIS — I4891 Unspecified atrial fibrillation: Secondary | ICD-10-CM | POA: Diagnosis not present

## 2018-06-02 DIAGNOSIS — I69334 Monoplegia of upper limb following cerebral infarction affecting left non-dominant side: Secondary | ICD-10-CM | POA: Diagnosis not present

## 2018-06-02 DIAGNOSIS — Z483 Aftercare following surgery for neoplasm: Secondary | ICD-10-CM | POA: Diagnosis not present

## 2018-06-03 ENCOUNTER — Ambulatory Visit: Payer: Medicare Other | Admitting: Cardiology

## 2018-06-04 DIAGNOSIS — C4442 Squamous cell carcinoma of skin of scalp and neck: Secondary | ICD-10-CM | POA: Diagnosis not present

## 2018-06-04 DIAGNOSIS — D649 Anemia, unspecified: Secondary | ICD-10-CM | POA: Diagnosis not present

## 2018-06-04 DIAGNOSIS — I69334 Monoplegia of upper limb following cerebral infarction affecting left non-dominant side: Secondary | ICD-10-CM | POA: Diagnosis not present

## 2018-06-04 DIAGNOSIS — Z483 Aftercare following surgery for neoplasm: Secondary | ICD-10-CM | POA: Diagnosis not present

## 2018-06-04 DIAGNOSIS — I10 Essential (primary) hypertension: Secondary | ICD-10-CM | POA: Diagnosis not present

## 2018-06-04 DIAGNOSIS — I4891 Unspecified atrial fibrillation: Secondary | ICD-10-CM | POA: Diagnosis not present

## 2018-06-06 ENCOUNTER — Ambulatory Visit: Payer: Medicare Other | Admitting: Cardiology

## 2018-06-07 DIAGNOSIS — I10 Essential (primary) hypertension: Secondary | ICD-10-CM | POA: Diagnosis not present

## 2018-06-07 DIAGNOSIS — Z483 Aftercare following surgery for neoplasm: Secondary | ICD-10-CM | POA: Diagnosis not present

## 2018-06-07 DIAGNOSIS — D649 Anemia, unspecified: Secondary | ICD-10-CM | POA: Diagnosis not present

## 2018-06-07 DIAGNOSIS — I4891 Unspecified atrial fibrillation: Secondary | ICD-10-CM | POA: Diagnosis not present

## 2018-06-07 DIAGNOSIS — C4442 Squamous cell carcinoma of skin of scalp and neck: Secondary | ICD-10-CM | POA: Diagnosis not present

## 2018-06-07 DIAGNOSIS — I69334 Monoplegia of upper limb following cerebral infarction affecting left non-dominant side: Secondary | ICD-10-CM | POA: Diagnosis not present

## 2018-06-10 DIAGNOSIS — I10 Essential (primary) hypertension: Secondary | ICD-10-CM | POA: Diagnosis not present

## 2018-06-10 DIAGNOSIS — D649 Anemia, unspecified: Secondary | ICD-10-CM | POA: Diagnosis not present

## 2018-06-10 DIAGNOSIS — I4891 Unspecified atrial fibrillation: Secondary | ICD-10-CM | POA: Diagnosis not present

## 2018-06-10 DIAGNOSIS — C4442 Squamous cell carcinoma of skin of scalp and neck: Secondary | ICD-10-CM | POA: Diagnosis not present

## 2018-06-10 DIAGNOSIS — Z483 Aftercare following surgery for neoplasm: Secondary | ICD-10-CM | POA: Diagnosis not present

## 2018-06-10 DIAGNOSIS — I69334 Monoplegia of upper limb following cerebral infarction affecting left non-dominant side: Secondary | ICD-10-CM | POA: Diagnosis not present

## 2018-06-17 DIAGNOSIS — D649 Anemia, unspecified: Secondary | ICD-10-CM | POA: Diagnosis not present

## 2018-06-17 DIAGNOSIS — I4891 Unspecified atrial fibrillation: Secondary | ICD-10-CM | POA: Diagnosis not present

## 2018-06-17 DIAGNOSIS — Z483 Aftercare following surgery for neoplasm: Secondary | ICD-10-CM | POA: Diagnosis not present

## 2018-06-17 DIAGNOSIS — C4442 Squamous cell carcinoma of skin of scalp and neck: Secondary | ICD-10-CM | POA: Diagnosis not present

## 2018-06-17 DIAGNOSIS — I10 Essential (primary) hypertension: Secondary | ICD-10-CM | POA: Diagnosis not present

## 2018-06-17 DIAGNOSIS — I69334 Monoplegia of upper limb following cerebral infarction affecting left non-dominant side: Secondary | ICD-10-CM | POA: Diagnosis not present

## 2018-06-24 DIAGNOSIS — C4442 Squamous cell carcinoma of skin of scalp and neck: Secondary | ICD-10-CM | POA: Diagnosis not present

## 2018-06-24 DIAGNOSIS — I10 Essential (primary) hypertension: Secondary | ICD-10-CM | POA: Diagnosis not present

## 2018-06-24 DIAGNOSIS — I4891 Unspecified atrial fibrillation: Secondary | ICD-10-CM | POA: Diagnosis not present

## 2018-06-24 DIAGNOSIS — I69334 Monoplegia of upper limb following cerebral infarction affecting left non-dominant side: Secondary | ICD-10-CM | POA: Diagnosis not present

## 2018-06-24 DIAGNOSIS — Z483 Aftercare following surgery for neoplasm: Secondary | ICD-10-CM | POA: Diagnosis not present

## 2018-06-24 DIAGNOSIS — D649 Anemia, unspecified: Secondary | ICD-10-CM | POA: Diagnosis not present

## 2018-07-10 DIAGNOSIS — C4442 Squamous cell carcinoma of skin of scalp and neck: Secondary | ICD-10-CM | POA: Diagnosis not present

## 2018-07-12 DIAGNOSIS — D649 Anemia, unspecified: Secondary | ICD-10-CM | POA: Diagnosis not present

## 2018-07-12 DIAGNOSIS — I4891 Unspecified atrial fibrillation: Secondary | ICD-10-CM | POA: Diagnosis not present

## 2018-07-12 DIAGNOSIS — I10 Essential (primary) hypertension: Secondary | ICD-10-CM | POA: Diagnosis not present

## 2018-07-12 DIAGNOSIS — M1991 Primary osteoarthritis, unspecified site: Secondary | ICD-10-CM | POA: Diagnosis not present

## 2018-07-12 DIAGNOSIS — Z87891 Personal history of nicotine dependence: Secondary | ICD-10-CM | POA: Diagnosis not present

## 2018-07-12 DIAGNOSIS — I69334 Monoplegia of upper limb following cerebral infarction affecting left non-dominant side: Secondary | ICD-10-CM | POA: Diagnosis not present

## 2018-07-12 DIAGNOSIS — C4442 Squamous cell carcinoma of skin of scalp and neck: Secondary | ICD-10-CM | POA: Diagnosis not present

## 2018-07-12 DIAGNOSIS — Z602 Problems related to living alone: Secondary | ICD-10-CM | POA: Diagnosis not present

## 2018-07-12 DIAGNOSIS — Z483 Aftercare following surgery for neoplasm: Secondary | ICD-10-CM | POA: Diagnosis not present

## 2018-07-12 DIAGNOSIS — Z7901 Long term (current) use of anticoagulants: Secondary | ICD-10-CM | POA: Diagnosis not present

## 2018-07-12 DIAGNOSIS — Z791 Long term (current) use of non-steroidal anti-inflammatories (NSAID): Secondary | ICD-10-CM | POA: Diagnosis not present

## 2018-07-12 DIAGNOSIS — Z9181 History of falling: Secondary | ICD-10-CM | POA: Diagnosis not present

## 2018-07-17 DIAGNOSIS — C4442 Squamous cell carcinoma of skin of scalp and neck: Secondary | ICD-10-CM | POA: Diagnosis not present

## 2018-07-17 DIAGNOSIS — D485 Neoplasm of uncertain behavior of skin: Secondary | ICD-10-CM | POA: Diagnosis not present

## 2018-07-22 DIAGNOSIS — Z483 Aftercare following surgery for neoplasm: Secondary | ICD-10-CM | POA: Diagnosis not present

## 2018-07-22 DIAGNOSIS — I69334 Monoplegia of upper limb following cerebral infarction affecting left non-dominant side: Secondary | ICD-10-CM | POA: Diagnosis not present

## 2018-07-22 DIAGNOSIS — C4442 Squamous cell carcinoma of skin of scalp and neck: Secondary | ICD-10-CM | POA: Diagnosis not present

## 2018-07-22 DIAGNOSIS — I10 Essential (primary) hypertension: Secondary | ICD-10-CM | POA: Diagnosis not present

## 2018-07-22 DIAGNOSIS — D649 Anemia, unspecified: Secondary | ICD-10-CM | POA: Diagnosis not present

## 2018-07-22 DIAGNOSIS — I4891 Unspecified atrial fibrillation: Secondary | ICD-10-CM | POA: Diagnosis not present

## 2018-07-24 DIAGNOSIS — C4442 Squamous cell carcinoma of skin of scalp and neck: Secondary | ICD-10-CM | POA: Diagnosis not present

## 2018-07-26 DIAGNOSIS — C4490 Unspecified malignant neoplasm of skin, unspecified: Secondary | ICD-10-CM | POA: Diagnosis not present

## 2018-07-27 DIAGNOSIS — D649 Anemia, unspecified: Secondary | ICD-10-CM | POA: Diagnosis not present

## 2018-07-27 DIAGNOSIS — Z483 Aftercare following surgery for neoplasm: Secondary | ICD-10-CM | POA: Diagnosis not present

## 2018-07-27 DIAGNOSIS — C4442 Squamous cell carcinoma of skin of scalp and neck: Secondary | ICD-10-CM | POA: Diagnosis not present

## 2018-07-27 DIAGNOSIS — I4891 Unspecified atrial fibrillation: Secondary | ICD-10-CM | POA: Diagnosis not present

## 2018-07-27 DIAGNOSIS — I10 Essential (primary) hypertension: Secondary | ICD-10-CM | POA: Diagnosis not present

## 2018-07-27 DIAGNOSIS — I69334 Monoplegia of upper limb following cerebral infarction affecting left non-dominant side: Secondary | ICD-10-CM | POA: Diagnosis not present

## 2018-07-29 DIAGNOSIS — I69334 Monoplegia of upper limb following cerebral infarction affecting left non-dominant side: Secondary | ICD-10-CM | POA: Diagnosis not present

## 2018-07-29 DIAGNOSIS — C4442 Squamous cell carcinoma of skin of scalp and neck: Secondary | ICD-10-CM | POA: Diagnosis not present

## 2018-07-29 DIAGNOSIS — D649 Anemia, unspecified: Secondary | ICD-10-CM | POA: Diagnosis not present

## 2018-07-29 DIAGNOSIS — I4891 Unspecified atrial fibrillation: Secondary | ICD-10-CM | POA: Diagnosis not present

## 2018-07-29 DIAGNOSIS — Z483 Aftercare following surgery for neoplasm: Secondary | ICD-10-CM | POA: Diagnosis not present

## 2018-07-29 DIAGNOSIS — I10 Essential (primary) hypertension: Secondary | ICD-10-CM | POA: Diagnosis not present

## 2018-07-31 DIAGNOSIS — Z886 Allergy status to analgesic agent status: Secondary | ICD-10-CM | POA: Diagnosis not present

## 2018-07-31 DIAGNOSIS — Z79899 Other long term (current) drug therapy: Secondary | ICD-10-CM | POA: Diagnosis not present

## 2018-07-31 DIAGNOSIS — I371 Nonrheumatic pulmonary valve insufficiency: Secondary | ICD-10-CM | POA: Diagnosis not present

## 2018-07-31 DIAGNOSIS — Z01810 Encounter for preprocedural cardiovascular examination: Secondary | ICD-10-CM | POA: Diagnosis not present

## 2018-07-31 DIAGNOSIS — I4891 Unspecified atrial fibrillation: Secondary | ICD-10-CM | POA: Diagnosis present

## 2018-07-31 DIAGNOSIS — Z8673 Personal history of transient ischemic attack (TIA), and cerebral infarction without residual deficits: Secondary | ICD-10-CM | POA: Diagnosis not present

## 2018-07-31 DIAGNOSIS — Z7901 Long term (current) use of anticoagulants: Secondary | ICD-10-CM | POA: Diagnosis not present

## 2018-07-31 DIAGNOSIS — Z9089 Acquired absence of other organs: Secondary | ICD-10-CM | POA: Diagnosis not present

## 2018-07-31 DIAGNOSIS — I340 Nonrheumatic mitral (valve) insufficiency: Secondary | ICD-10-CM | POA: Diagnosis not present

## 2018-07-31 DIAGNOSIS — R590 Localized enlarged lymph nodes: Secondary | ICD-10-CM | POA: Diagnosis present

## 2018-07-31 DIAGNOSIS — I10 Essential (primary) hypertension: Secondary | ICD-10-CM | POA: Diagnosis present

## 2018-07-31 DIAGNOSIS — C4490 Unspecified malignant neoplasm of skin, unspecified: Secondary | ICD-10-CM | POA: Diagnosis not present

## 2018-07-31 DIAGNOSIS — I361 Nonrheumatic tricuspid (valve) insufficiency: Secondary | ICD-10-CM | POA: Diagnosis not present

## 2018-07-31 DIAGNOSIS — D72829 Elevated white blood cell count, unspecified: Secondary | ICD-10-CM | POA: Diagnosis not present

## 2018-07-31 DIAGNOSIS — C4442 Squamous cell carcinoma of skin of scalp and neck: Secondary | ICD-10-CM | POA: Diagnosis present

## 2018-07-31 DIAGNOSIS — Z96653 Presence of artificial knee joint, bilateral: Secondary | ICD-10-CM | POA: Diagnosis present

## 2018-07-31 DIAGNOSIS — I2729 Other secondary pulmonary hypertension: Secondary | ICD-10-CM | POA: Diagnosis not present

## 2018-08-05 DIAGNOSIS — D649 Anemia, unspecified: Secondary | ICD-10-CM | POA: Diagnosis not present

## 2018-08-05 DIAGNOSIS — I4891 Unspecified atrial fibrillation: Secondary | ICD-10-CM | POA: Diagnosis not present

## 2018-08-05 DIAGNOSIS — Z483 Aftercare following surgery for neoplasm: Secondary | ICD-10-CM | POA: Diagnosis not present

## 2018-08-05 DIAGNOSIS — C4442 Squamous cell carcinoma of skin of scalp and neck: Secondary | ICD-10-CM | POA: Diagnosis not present

## 2018-08-05 DIAGNOSIS — I10 Essential (primary) hypertension: Secondary | ICD-10-CM | POA: Diagnosis not present

## 2018-08-05 DIAGNOSIS — I69334 Monoplegia of upper limb following cerebral infarction affecting left non-dominant side: Secondary | ICD-10-CM | POA: Diagnosis not present

## 2018-08-07 DIAGNOSIS — I69334 Monoplegia of upper limb following cerebral infarction affecting left non-dominant side: Secondary | ICD-10-CM | POA: Diagnosis not present

## 2018-08-07 DIAGNOSIS — C4442 Squamous cell carcinoma of skin of scalp and neck: Secondary | ICD-10-CM | POA: Diagnosis not present

## 2018-08-07 DIAGNOSIS — Z483 Aftercare following surgery for neoplasm: Secondary | ICD-10-CM | POA: Diagnosis not present

## 2018-08-07 DIAGNOSIS — I10 Essential (primary) hypertension: Secondary | ICD-10-CM | POA: Diagnosis not present

## 2018-08-07 DIAGNOSIS — D649 Anemia, unspecified: Secondary | ICD-10-CM | POA: Diagnosis not present

## 2018-08-07 DIAGNOSIS — I4891 Unspecified atrial fibrillation: Secondary | ICD-10-CM | POA: Diagnosis not present

## 2018-08-11 DIAGNOSIS — D649 Anemia, unspecified: Secondary | ICD-10-CM | POA: Diagnosis not present

## 2018-08-11 DIAGNOSIS — Z87891 Personal history of nicotine dependence: Secondary | ICD-10-CM | POA: Diagnosis not present

## 2018-08-11 DIAGNOSIS — Z9181 History of falling: Secondary | ICD-10-CM | POA: Diagnosis not present

## 2018-08-11 DIAGNOSIS — Z791 Long term (current) use of non-steroidal anti-inflammatories (NSAID): Secondary | ICD-10-CM | POA: Diagnosis not present

## 2018-08-11 DIAGNOSIS — I4891 Unspecified atrial fibrillation: Secondary | ICD-10-CM | POA: Diagnosis not present

## 2018-08-11 DIAGNOSIS — Z602 Problems related to living alone: Secondary | ICD-10-CM | POA: Diagnosis not present

## 2018-08-11 DIAGNOSIS — Z48817 Encounter for surgical aftercare following surgery on the skin and subcutaneous tissue: Secondary | ICD-10-CM | POA: Diagnosis not present

## 2018-08-11 DIAGNOSIS — Z483 Aftercare following surgery for neoplasm: Secondary | ICD-10-CM | POA: Diagnosis not present

## 2018-08-11 DIAGNOSIS — Z48298 Encounter for aftercare following other organ transplant: Secondary | ICD-10-CM | POA: Diagnosis not present

## 2018-08-11 DIAGNOSIS — C4442 Squamous cell carcinoma of skin of scalp and neck: Secondary | ICD-10-CM | POA: Diagnosis not present

## 2018-08-11 DIAGNOSIS — M1991 Primary osteoarthritis, unspecified site: Secondary | ICD-10-CM | POA: Diagnosis not present

## 2018-08-11 DIAGNOSIS — I69334 Monoplegia of upper limb following cerebral infarction affecting left non-dominant side: Secondary | ICD-10-CM | POA: Diagnosis not present

## 2018-08-11 DIAGNOSIS — I10 Essential (primary) hypertension: Secondary | ICD-10-CM | POA: Diagnosis not present

## 2018-08-11 DIAGNOSIS — Z7901 Long term (current) use of anticoagulants: Secondary | ICD-10-CM | POA: Diagnosis not present

## 2018-08-12 DIAGNOSIS — I10 Essential (primary) hypertension: Secondary | ICD-10-CM | POA: Diagnosis not present

## 2018-08-12 DIAGNOSIS — Z48817 Encounter for surgical aftercare following surgery on the skin and subcutaneous tissue: Secondary | ICD-10-CM | POA: Diagnosis not present

## 2018-08-12 DIAGNOSIS — Z483 Aftercare following surgery for neoplasm: Secondary | ICD-10-CM | POA: Diagnosis not present

## 2018-08-12 DIAGNOSIS — C4442 Squamous cell carcinoma of skin of scalp and neck: Secondary | ICD-10-CM | POA: Diagnosis not present

## 2018-08-12 DIAGNOSIS — Z48298 Encounter for aftercare following other organ transplant: Secondary | ICD-10-CM | POA: Diagnosis not present

## 2018-08-12 DIAGNOSIS — I4891 Unspecified atrial fibrillation: Secondary | ICD-10-CM | POA: Diagnosis not present

## 2018-08-14 DIAGNOSIS — I10 Essential (primary) hypertension: Secondary | ICD-10-CM | POA: Diagnosis not present

## 2018-08-14 DIAGNOSIS — Z483 Aftercare following surgery for neoplasm: Secondary | ICD-10-CM | POA: Diagnosis not present

## 2018-08-14 DIAGNOSIS — C4442 Squamous cell carcinoma of skin of scalp and neck: Secondary | ICD-10-CM | POA: Diagnosis not present

## 2018-08-14 DIAGNOSIS — Z48298 Encounter for aftercare following other organ transplant: Secondary | ICD-10-CM | POA: Diagnosis not present

## 2018-08-14 DIAGNOSIS — Z48817 Encounter for surgical aftercare following surgery on the skin and subcutaneous tissue: Secondary | ICD-10-CM | POA: Diagnosis not present

## 2018-08-14 DIAGNOSIS — I4891 Unspecified atrial fibrillation: Secondary | ICD-10-CM | POA: Diagnosis not present

## 2018-08-16 DIAGNOSIS — Z48817 Encounter for surgical aftercare following surgery on the skin and subcutaneous tissue: Secondary | ICD-10-CM | POA: Diagnosis not present

## 2018-08-16 DIAGNOSIS — I10 Essential (primary) hypertension: Secondary | ICD-10-CM | POA: Diagnosis not present

## 2018-08-16 DIAGNOSIS — C4442 Squamous cell carcinoma of skin of scalp and neck: Secondary | ICD-10-CM | POA: Diagnosis not present

## 2018-08-16 DIAGNOSIS — I4891 Unspecified atrial fibrillation: Secondary | ICD-10-CM | POA: Diagnosis not present

## 2018-08-16 DIAGNOSIS — Z483 Aftercare following surgery for neoplasm: Secondary | ICD-10-CM | POA: Diagnosis not present

## 2018-08-16 DIAGNOSIS — Z48298 Encounter for aftercare following other organ transplant: Secondary | ICD-10-CM | POA: Diagnosis not present

## 2018-08-19 DIAGNOSIS — I4891 Unspecified atrial fibrillation: Secondary | ICD-10-CM | POA: Diagnosis not present

## 2018-08-19 DIAGNOSIS — I10 Essential (primary) hypertension: Secondary | ICD-10-CM | POA: Diagnosis not present

## 2018-08-19 DIAGNOSIS — C4442 Squamous cell carcinoma of skin of scalp and neck: Secondary | ICD-10-CM | POA: Diagnosis not present

## 2018-08-19 DIAGNOSIS — Z48298 Encounter for aftercare following other organ transplant: Secondary | ICD-10-CM | POA: Diagnosis not present

## 2018-08-19 DIAGNOSIS — Z48817 Encounter for surgical aftercare following surgery on the skin and subcutaneous tissue: Secondary | ICD-10-CM | POA: Diagnosis not present

## 2018-08-19 DIAGNOSIS — Z483 Aftercare following surgery for neoplasm: Secondary | ICD-10-CM | POA: Diagnosis not present

## 2018-08-21 DIAGNOSIS — C4442 Squamous cell carcinoma of skin of scalp and neck: Secondary | ICD-10-CM | POA: Diagnosis not present

## 2018-08-21 DIAGNOSIS — I4891 Unspecified atrial fibrillation: Secondary | ICD-10-CM | POA: Diagnosis not present

## 2018-08-21 DIAGNOSIS — Z48817 Encounter for surgical aftercare following surgery on the skin and subcutaneous tissue: Secondary | ICD-10-CM | POA: Diagnosis not present

## 2018-08-21 DIAGNOSIS — Z483 Aftercare following surgery for neoplasm: Secondary | ICD-10-CM | POA: Diagnosis not present

## 2018-08-21 DIAGNOSIS — Z48298 Encounter for aftercare following other organ transplant: Secondary | ICD-10-CM | POA: Diagnosis not present

## 2018-08-21 DIAGNOSIS — I10 Essential (primary) hypertension: Secondary | ICD-10-CM | POA: Diagnosis not present

## 2018-08-23 DIAGNOSIS — I4891 Unspecified atrial fibrillation: Secondary | ICD-10-CM | POA: Diagnosis not present

## 2018-08-23 DIAGNOSIS — C4442 Squamous cell carcinoma of skin of scalp and neck: Secondary | ICD-10-CM | POA: Diagnosis not present

## 2018-08-23 DIAGNOSIS — Z48298 Encounter for aftercare following other organ transplant: Secondary | ICD-10-CM | POA: Diagnosis not present

## 2018-08-23 DIAGNOSIS — Z483 Aftercare following surgery for neoplasm: Secondary | ICD-10-CM | POA: Diagnosis not present

## 2018-08-23 DIAGNOSIS — Z48817 Encounter for surgical aftercare following surgery on the skin and subcutaneous tissue: Secondary | ICD-10-CM | POA: Diagnosis not present

## 2018-08-23 DIAGNOSIS — I10 Essential (primary) hypertension: Secondary | ICD-10-CM | POA: Diagnosis not present

## 2018-08-26 DIAGNOSIS — C4442 Squamous cell carcinoma of skin of scalp and neck: Secondary | ICD-10-CM | POA: Diagnosis not present

## 2018-08-26 DIAGNOSIS — I4891 Unspecified atrial fibrillation: Secondary | ICD-10-CM | POA: Diagnosis not present

## 2018-08-26 DIAGNOSIS — Z483 Aftercare following surgery for neoplasm: Secondary | ICD-10-CM | POA: Diagnosis not present

## 2018-08-26 DIAGNOSIS — I10 Essential (primary) hypertension: Secondary | ICD-10-CM | POA: Diagnosis not present

## 2018-08-26 DIAGNOSIS — Z48817 Encounter for surgical aftercare following surgery on the skin and subcutaneous tissue: Secondary | ICD-10-CM | POA: Diagnosis not present

## 2018-08-26 DIAGNOSIS — Z48298 Encounter for aftercare following other organ transplant: Secondary | ICD-10-CM | POA: Diagnosis not present

## 2018-08-28 DIAGNOSIS — Z483 Aftercare following surgery for neoplasm: Secondary | ICD-10-CM | POA: Diagnosis not present

## 2018-08-28 DIAGNOSIS — Z48817 Encounter for surgical aftercare following surgery on the skin and subcutaneous tissue: Secondary | ICD-10-CM | POA: Diagnosis not present

## 2018-08-28 DIAGNOSIS — I4891 Unspecified atrial fibrillation: Secondary | ICD-10-CM | POA: Diagnosis not present

## 2018-08-28 DIAGNOSIS — Z48298 Encounter for aftercare following other organ transplant: Secondary | ICD-10-CM | POA: Diagnosis not present

## 2018-08-28 DIAGNOSIS — C4442 Squamous cell carcinoma of skin of scalp and neck: Secondary | ICD-10-CM | POA: Diagnosis not present

## 2018-08-28 DIAGNOSIS — I10 Essential (primary) hypertension: Secondary | ICD-10-CM | POA: Diagnosis not present

## 2018-08-30 DIAGNOSIS — Z48298 Encounter for aftercare following other organ transplant: Secondary | ICD-10-CM | POA: Diagnosis not present

## 2018-08-30 DIAGNOSIS — I10 Essential (primary) hypertension: Secondary | ICD-10-CM | POA: Diagnosis not present

## 2018-08-30 DIAGNOSIS — Z48817 Encounter for surgical aftercare following surgery on the skin and subcutaneous tissue: Secondary | ICD-10-CM | POA: Diagnosis not present

## 2018-08-30 DIAGNOSIS — C4442 Squamous cell carcinoma of skin of scalp and neck: Secondary | ICD-10-CM | POA: Diagnosis not present

## 2018-08-30 DIAGNOSIS — Z483 Aftercare following surgery for neoplasm: Secondary | ICD-10-CM | POA: Diagnosis not present

## 2018-08-30 DIAGNOSIS — I4891 Unspecified atrial fibrillation: Secondary | ICD-10-CM | POA: Diagnosis not present

## 2018-09-02 DIAGNOSIS — Z48298 Encounter for aftercare following other organ transplant: Secondary | ICD-10-CM | POA: Diagnosis not present

## 2018-09-02 DIAGNOSIS — I10 Essential (primary) hypertension: Secondary | ICD-10-CM | POA: Diagnosis not present

## 2018-09-02 DIAGNOSIS — Z483 Aftercare following surgery for neoplasm: Secondary | ICD-10-CM | POA: Diagnosis not present

## 2018-09-02 DIAGNOSIS — I4891 Unspecified atrial fibrillation: Secondary | ICD-10-CM | POA: Diagnosis not present

## 2018-09-02 DIAGNOSIS — Z48817 Encounter for surgical aftercare following surgery on the skin and subcutaneous tissue: Secondary | ICD-10-CM | POA: Diagnosis not present

## 2018-09-02 DIAGNOSIS — C4442 Squamous cell carcinoma of skin of scalp and neck: Secondary | ICD-10-CM | POA: Diagnosis not present

## 2018-09-04 DIAGNOSIS — I10 Essential (primary) hypertension: Secondary | ICD-10-CM | POA: Diagnosis not present

## 2018-09-04 DIAGNOSIS — C4442 Squamous cell carcinoma of skin of scalp and neck: Secondary | ICD-10-CM | POA: Diagnosis not present

## 2018-09-04 DIAGNOSIS — I4891 Unspecified atrial fibrillation: Secondary | ICD-10-CM | POA: Diagnosis not present

## 2018-09-04 DIAGNOSIS — Z48298 Encounter for aftercare following other organ transplant: Secondary | ICD-10-CM | POA: Diagnosis not present

## 2018-09-04 DIAGNOSIS — Z48817 Encounter for surgical aftercare following surgery on the skin and subcutaneous tissue: Secondary | ICD-10-CM | POA: Diagnosis not present

## 2018-09-04 DIAGNOSIS — Z483 Aftercare following surgery for neoplasm: Secondary | ICD-10-CM | POA: Diagnosis not present

## 2018-09-18 DIAGNOSIS — C44329 Squamous cell carcinoma of skin of other parts of face: Secondary | ICD-10-CM | POA: Diagnosis not present

## 2018-09-18 DIAGNOSIS — D485 Neoplasm of uncertain behavior of skin: Secondary | ICD-10-CM | POA: Diagnosis not present

## 2018-09-18 DIAGNOSIS — Z85828 Personal history of other malignant neoplasm of skin: Secondary | ICD-10-CM | POA: Diagnosis not present

## 2018-09-24 DIAGNOSIS — C4442 Squamous cell carcinoma of skin of scalp and neck: Secondary | ICD-10-CM | POA: Diagnosis not present

## 2018-09-24 DIAGNOSIS — R531 Weakness: Secondary | ICD-10-CM | POA: Diagnosis not present

## 2018-09-24 DIAGNOSIS — D649 Anemia, unspecified: Secondary | ICD-10-CM | POA: Diagnosis not present

## 2018-09-24 DIAGNOSIS — I10 Essential (primary) hypertension: Secondary | ICD-10-CM | POA: Diagnosis not present

## 2018-09-25 DIAGNOSIS — D649 Anemia, unspecified: Secondary | ICD-10-CM | POA: Diagnosis not present

## 2018-09-25 DIAGNOSIS — M1991 Primary osteoarthritis, unspecified site: Secondary | ICD-10-CM | POA: Diagnosis not present

## 2018-09-25 DIAGNOSIS — Z7901 Long term (current) use of anticoagulants: Secondary | ICD-10-CM | POA: Diagnosis not present

## 2018-09-25 DIAGNOSIS — C4442 Squamous cell carcinoma of skin of scalp and neck: Secondary | ICD-10-CM | POA: Diagnosis not present

## 2018-09-25 DIAGNOSIS — Z9181 History of falling: Secondary | ICD-10-CM | POA: Diagnosis not present

## 2018-09-25 DIAGNOSIS — I69334 Monoplegia of upper limb following cerebral infarction affecting left non-dominant side: Secondary | ICD-10-CM | POA: Diagnosis not present

## 2018-09-25 DIAGNOSIS — Z791 Long term (current) use of non-steroidal anti-inflammatories (NSAID): Secondary | ICD-10-CM | POA: Diagnosis not present

## 2018-09-25 DIAGNOSIS — I482 Chronic atrial fibrillation, unspecified: Secondary | ICD-10-CM | POA: Diagnosis not present

## 2018-09-25 DIAGNOSIS — I10 Essential (primary) hypertension: Secondary | ICD-10-CM | POA: Diagnosis not present

## 2018-09-25 DIAGNOSIS — Z87891 Personal history of nicotine dependence: Secondary | ICD-10-CM | POA: Diagnosis not present

## 2018-09-25 DIAGNOSIS — Z483 Aftercare following surgery for neoplasm: Secondary | ICD-10-CM | POA: Diagnosis not present

## 2018-09-25 DIAGNOSIS — Z602 Problems related to living alone: Secondary | ICD-10-CM | POA: Diagnosis not present

## 2018-10-02 DIAGNOSIS — I10 Essential (primary) hypertension: Secondary | ICD-10-CM | POA: Diagnosis not present

## 2018-10-02 DIAGNOSIS — Z483 Aftercare following surgery for neoplasm: Secondary | ICD-10-CM | POA: Diagnosis not present

## 2018-10-02 DIAGNOSIS — I482 Chronic atrial fibrillation, unspecified: Secondary | ICD-10-CM | POA: Diagnosis not present

## 2018-10-02 DIAGNOSIS — C4442 Squamous cell carcinoma of skin of scalp and neck: Secondary | ICD-10-CM | POA: Diagnosis not present

## 2018-10-02 DIAGNOSIS — I69334 Monoplegia of upper limb following cerebral infarction affecting left non-dominant side: Secondary | ICD-10-CM | POA: Diagnosis not present

## 2018-10-02 DIAGNOSIS — D649 Anemia, unspecified: Secondary | ICD-10-CM | POA: Diagnosis not present

## 2018-10-09 DIAGNOSIS — I69334 Monoplegia of upper limb following cerebral infarction affecting left non-dominant side: Secondary | ICD-10-CM | POA: Diagnosis not present

## 2018-10-09 DIAGNOSIS — D649 Anemia, unspecified: Secondary | ICD-10-CM | POA: Diagnosis not present

## 2018-10-09 DIAGNOSIS — I10 Essential (primary) hypertension: Secondary | ICD-10-CM | POA: Diagnosis not present

## 2018-10-09 DIAGNOSIS — Z483 Aftercare following surgery for neoplasm: Secondary | ICD-10-CM | POA: Diagnosis not present

## 2018-10-09 DIAGNOSIS — C4442 Squamous cell carcinoma of skin of scalp and neck: Secondary | ICD-10-CM | POA: Diagnosis not present

## 2018-10-09 DIAGNOSIS — I482 Chronic atrial fibrillation, unspecified: Secondary | ICD-10-CM | POA: Diagnosis not present

## 2018-10-10 DIAGNOSIS — R54 Age-related physical debility: Secondary | ICD-10-CM | POA: Diagnosis not present

## 2018-10-10 DIAGNOSIS — L989 Disorder of the skin and subcutaneous tissue, unspecified: Secondary | ICD-10-CM | POA: Diagnosis not present

## 2018-10-10 DIAGNOSIS — K5900 Constipation, unspecified: Secondary | ICD-10-CM | POA: Diagnosis not present

## 2018-10-10 DIAGNOSIS — C4442 Squamous cell carcinoma of skin of scalp and neck: Secondary | ICD-10-CM | POA: Diagnosis not present

## 2018-10-10 DIAGNOSIS — I4891 Unspecified atrial fibrillation: Secondary | ICD-10-CM | POA: Diagnosis not present

## 2018-10-10 DIAGNOSIS — G894 Chronic pain syndrome: Secondary | ICD-10-CM | POA: Diagnosis not present

## 2018-10-10 DIAGNOSIS — R1900 Intra-abdominal and pelvic swelling, mass and lump, unspecified site: Secondary | ICD-10-CM | POA: Diagnosis not present

## 2018-10-10 DIAGNOSIS — R531 Weakness: Secondary | ICD-10-CM | POA: Diagnosis not present

## 2018-10-11 DIAGNOSIS — I10 Essential (primary) hypertension: Secondary | ICD-10-CM | POA: Diagnosis not present

## 2018-10-11 DIAGNOSIS — I69354 Hemiplegia and hemiparesis following cerebral infarction affecting left non-dominant side: Secondary | ICD-10-CM | POA: Diagnosis not present

## 2018-10-11 DIAGNOSIS — C4442 Squamous cell carcinoma of skin of scalp and neck: Secondary | ICD-10-CM | POA: Diagnosis not present

## 2018-10-11 DIAGNOSIS — I4821 Permanent atrial fibrillation: Secondary | ICD-10-CM | POA: Diagnosis not present

## 2018-10-14 DIAGNOSIS — I4821 Permanent atrial fibrillation: Secondary | ICD-10-CM | POA: Diagnosis not present

## 2018-10-14 DIAGNOSIS — I69354 Hemiplegia and hemiparesis following cerebral infarction affecting left non-dominant side: Secondary | ICD-10-CM | POA: Diagnosis not present

## 2018-10-14 DIAGNOSIS — C4442 Squamous cell carcinoma of skin of scalp and neck: Secondary | ICD-10-CM | POA: Diagnosis not present

## 2018-10-14 DIAGNOSIS — I10 Essential (primary) hypertension: Secondary | ICD-10-CM | POA: Diagnosis not present

## 2018-10-21 DIAGNOSIS — I4821 Permanent atrial fibrillation: Secondary | ICD-10-CM | POA: Diagnosis not present

## 2018-10-21 DIAGNOSIS — I10 Essential (primary) hypertension: Secondary | ICD-10-CM | POA: Diagnosis not present

## 2018-10-21 DIAGNOSIS — S0001XA Abrasion of scalp, initial encounter: Secondary | ICD-10-CM | POA: Diagnosis not present

## 2018-10-21 DIAGNOSIS — S0003XA Contusion of scalp, initial encounter: Secondary | ICD-10-CM | POA: Diagnosis not present

## 2018-10-21 DIAGNOSIS — I69354 Hemiplegia and hemiparesis following cerebral infarction affecting left non-dominant side: Secondary | ICD-10-CM | POA: Diagnosis not present

## 2018-10-21 DIAGNOSIS — C4442 Squamous cell carcinoma of skin of scalp and neck: Secondary | ICD-10-CM | POA: Diagnosis not present

## 2018-10-22 DIAGNOSIS — C4442 Squamous cell carcinoma of skin of scalp and neck: Secondary | ICD-10-CM | POA: Diagnosis not present

## 2018-10-22 DIAGNOSIS — I10 Essential (primary) hypertension: Secondary | ICD-10-CM | POA: Diagnosis not present

## 2018-10-22 DIAGNOSIS — I4821 Permanent atrial fibrillation: Secondary | ICD-10-CM | POA: Diagnosis not present

## 2018-10-22 DIAGNOSIS — I69354 Hemiplegia and hemiparesis following cerebral infarction affecting left non-dominant side: Secondary | ICD-10-CM | POA: Diagnosis not present

## 2018-10-25 DIAGNOSIS — I4821 Permanent atrial fibrillation: Secondary | ICD-10-CM | POA: Diagnosis not present

## 2018-10-25 DIAGNOSIS — I10 Essential (primary) hypertension: Secondary | ICD-10-CM | POA: Diagnosis not present

## 2018-10-25 DIAGNOSIS — I69354 Hemiplegia and hemiparesis following cerebral infarction affecting left non-dominant side: Secondary | ICD-10-CM | POA: Diagnosis not present

## 2018-10-25 DIAGNOSIS — C4442 Squamous cell carcinoma of skin of scalp and neck: Secondary | ICD-10-CM | POA: Diagnosis not present

## 2018-10-29 DIAGNOSIS — G893 Neoplasm related pain (acute) (chronic): Secondary | ICD-10-CM | POA: Diagnosis not present

## 2018-10-29 DIAGNOSIS — I4821 Permanent atrial fibrillation: Secondary | ICD-10-CM | POA: Diagnosis not present

## 2018-10-29 DIAGNOSIS — I69354 Hemiplegia and hemiparesis following cerebral infarction affecting left non-dominant side: Secondary | ICD-10-CM | POA: Diagnosis not present

## 2018-10-29 DIAGNOSIS — C4442 Squamous cell carcinoma of skin of scalp and neck: Secondary | ICD-10-CM | POA: Diagnosis not present

## 2018-10-29 DIAGNOSIS — K1121 Acute sialoadenitis: Secondary | ICD-10-CM | POA: Diagnosis not present

## 2018-10-29 DIAGNOSIS — K5903 Drug induced constipation: Secondary | ICD-10-CM | POA: Diagnosis not present

## 2018-10-29 DIAGNOSIS — R531 Weakness: Secondary | ICD-10-CM | POA: Diagnosis not present

## 2018-10-29 DIAGNOSIS — I10 Essential (primary) hypertension: Secondary | ICD-10-CM | POA: Diagnosis not present

## 2018-10-30 DIAGNOSIS — R531 Weakness: Secondary | ICD-10-CM | POA: Diagnosis not present

## 2018-10-30 DIAGNOSIS — S0003XA Contusion of scalp, initial encounter: Secondary | ICD-10-CM | POA: Diagnosis not present

## 2018-10-30 DIAGNOSIS — S0001XA Abrasion of scalp, initial encounter: Secondary | ICD-10-CM | POA: Diagnosis not present

## 2018-10-30 DIAGNOSIS — W19XXXA Unspecified fall, initial encounter: Secondary | ICD-10-CM | POA: Diagnosis not present

## 2018-10-30 DIAGNOSIS — R5381 Other malaise: Secondary | ICD-10-CM | POA: Diagnosis not present

## 2018-10-31 DIAGNOSIS — C4442 Squamous cell carcinoma of skin of scalp and neck: Secondary | ICD-10-CM | POA: Diagnosis not present

## 2018-10-31 DIAGNOSIS — I69354 Hemiplegia and hemiparesis following cerebral infarction affecting left non-dominant side: Secondary | ICD-10-CM | POA: Diagnosis not present

## 2018-10-31 DIAGNOSIS — I4821 Permanent atrial fibrillation: Secondary | ICD-10-CM | POA: Diagnosis not present

## 2018-10-31 DIAGNOSIS — I10 Essential (primary) hypertension: Secondary | ICD-10-CM | POA: Diagnosis not present

## 2018-11-01 DIAGNOSIS — I69354 Hemiplegia and hemiparesis following cerebral infarction affecting left non-dominant side: Secondary | ICD-10-CM | POA: Diagnosis not present

## 2018-11-01 DIAGNOSIS — I10 Essential (primary) hypertension: Secondary | ICD-10-CM | POA: Diagnosis not present

## 2018-11-01 DIAGNOSIS — I4821 Permanent atrial fibrillation: Secondary | ICD-10-CM | POA: Diagnosis not present

## 2018-11-01 DIAGNOSIS — C4442 Squamous cell carcinoma of skin of scalp and neck: Secondary | ICD-10-CM | POA: Diagnosis not present

## 2018-11-02 DIAGNOSIS — I10 Essential (primary) hypertension: Secondary | ICD-10-CM | POA: Diagnosis not present

## 2018-11-02 DIAGNOSIS — I4821 Permanent atrial fibrillation: Secondary | ICD-10-CM | POA: Diagnosis not present

## 2018-11-02 DIAGNOSIS — C4442 Squamous cell carcinoma of skin of scalp and neck: Secondary | ICD-10-CM | POA: Diagnosis not present

## 2018-11-02 DIAGNOSIS — I69354 Hemiplegia and hemiparesis following cerebral infarction affecting left non-dominant side: Secondary | ICD-10-CM | POA: Diagnosis not present

## 2018-11-03 DIAGNOSIS — I4821 Permanent atrial fibrillation: Secondary | ICD-10-CM | POA: Diagnosis not present

## 2018-11-03 DIAGNOSIS — I10 Essential (primary) hypertension: Secondary | ICD-10-CM | POA: Diagnosis not present

## 2018-11-03 DIAGNOSIS — C4442 Squamous cell carcinoma of skin of scalp and neck: Secondary | ICD-10-CM | POA: Diagnosis not present

## 2018-11-03 DIAGNOSIS — I69354 Hemiplegia and hemiparesis following cerebral infarction affecting left non-dominant side: Secondary | ICD-10-CM | POA: Diagnosis not present

## 2018-11-04 DIAGNOSIS — R531 Weakness: Secondary | ICD-10-CM | POA: Diagnosis not present

## 2018-11-04 DIAGNOSIS — G893 Neoplasm related pain (acute) (chronic): Secondary | ICD-10-CM | POA: Diagnosis not present

## 2018-11-04 DIAGNOSIS — C4442 Squamous cell carcinoma of skin of scalp and neck: Secondary | ICD-10-CM | POA: Diagnosis not present

## 2018-11-04 DIAGNOSIS — Z20828 Contact with and (suspected) exposure to other viral communicable diseases: Secondary | ICD-10-CM | POA: Diagnosis not present

## 2018-11-04 DIAGNOSIS — K5903 Drug induced constipation: Secondary | ICD-10-CM | POA: Diagnosis not present

## 2018-11-04 DIAGNOSIS — I10 Essential (primary) hypertension: Secondary | ICD-10-CM | POA: Diagnosis not present

## 2018-11-04 DIAGNOSIS — Z111 Encounter for screening for respiratory tuberculosis: Secondary | ICD-10-CM | POA: Diagnosis not present

## 2018-11-07 DIAGNOSIS — S0190XA Unspecified open wound of unspecified part of head, initial encounter: Secondary | ICD-10-CM | POA: Diagnosis not present

## 2018-11-07 DIAGNOSIS — I4821 Permanent atrial fibrillation: Secondary | ICD-10-CM | POA: Diagnosis not present

## 2018-11-07 DIAGNOSIS — M6281 Muscle weakness (generalized): Secondary | ICD-10-CM | POA: Diagnosis not present

## 2018-11-07 DIAGNOSIS — I10 Essential (primary) hypertension: Secondary | ICD-10-CM | POA: Diagnosis not present

## 2018-11-07 DIAGNOSIS — C4442 Squamous cell carcinoma of skin of scalp and neck: Secondary | ICD-10-CM | POA: Diagnosis not present

## 2018-11-07 DIAGNOSIS — R296 Repeated falls: Secondary | ICD-10-CM | POA: Diagnosis not present

## 2018-11-07 DIAGNOSIS — R54 Age-related physical debility: Secondary | ICD-10-CM | POA: Diagnosis not present

## 2018-11-07 DIAGNOSIS — I69354 Hemiplegia and hemiparesis following cerebral infarction affecting left non-dominant side: Secondary | ICD-10-CM | POA: Diagnosis not present

## 2018-11-07 DIAGNOSIS — I4891 Unspecified atrial fibrillation: Secondary | ICD-10-CM | POA: Diagnosis not present

## 2018-11-07 DIAGNOSIS — R269 Unspecified abnormalities of gait and mobility: Secondary | ICD-10-CM | POA: Diagnosis not present

## 2018-11-08 DIAGNOSIS — I69354 Hemiplegia and hemiparesis following cerebral infarction affecting left non-dominant side: Secondary | ICD-10-CM | POA: Diagnosis not present

## 2018-11-08 DIAGNOSIS — I10 Essential (primary) hypertension: Secondary | ICD-10-CM | POA: Diagnosis not present

## 2018-11-08 DIAGNOSIS — C4442 Squamous cell carcinoma of skin of scalp and neck: Secondary | ICD-10-CM | POA: Diagnosis not present

## 2018-11-08 DIAGNOSIS — I4821 Permanent atrial fibrillation: Secondary | ICD-10-CM | POA: Diagnosis not present

## 2018-11-11 ENCOUNTER — Emergency Department (HOSPITAL_COMMUNITY)

## 2018-11-11 ENCOUNTER — Encounter (HOSPITAL_COMMUNITY): Payer: Self-pay

## 2018-11-11 ENCOUNTER — Emergency Department (HOSPITAL_COMMUNITY)
Admission: EM | Admit: 2018-11-11 | Discharge: 2018-11-11 | Disposition: A | Discharge status: Home / Self Care | Attending: Emergency Medicine | Admitting: Emergency Medicine

## 2018-11-11 DIAGNOSIS — C760 Malignant neoplasm of head, face and neck: Secondary | ICD-10-CM | POA: Diagnosis not present

## 2018-11-11 DIAGNOSIS — T07XXXA Unspecified multiple injuries, initial encounter: Secondary | ICD-10-CM | POA: Diagnosis not present

## 2018-11-11 DIAGNOSIS — I4821 Permanent atrial fibrillation: Secondary | ICD-10-CM | POA: Diagnosis not present

## 2018-11-11 DIAGNOSIS — R52 Pain, unspecified: Secondary | ICD-10-CM | POA: Diagnosis not present

## 2018-11-11 DIAGNOSIS — W19XXXD Unspecified fall, subsequent encounter: Secondary | ICD-10-CM | POA: Diagnosis not present

## 2018-11-11 DIAGNOSIS — Y939 Activity, unspecified: Secondary | ICD-10-CM | POA: Insufficient documentation

## 2018-11-11 DIAGNOSIS — Y92129 Unspecified place in nursing home as the place of occurrence of the external cause: Secondary | ICD-10-CM | POA: Insufficient documentation

## 2018-11-11 DIAGNOSIS — S0101XA Laceration without foreign body of scalp, initial encounter: Secondary | ICD-10-CM | POA: Insufficient documentation

## 2018-11-11 DIAGNOSIS — Z87891 Personal history of nicotine dependence: Secondary | ICD-10-CM | POA: Diagnosis not present

## 2018-11-11 DIAGNOSIS — R58 Hemorrhage, not elsewhere classified: Secondary | ICD-10-CM | POA: Diagnosis not present

## 2018-11-11 DIAGNOSIS — D181 Lymphangioma, any site: Secondary | ICD-10-CM | POA: Diagnosis not present

## 2018-11-11 DIAGNOSIS — S0101XD Laceration without foreign body of scalp, subsequent encounter: Secondary | ICD-10-CM | POA: Diagnosis not present

## 2018-11-11 DIAGNOSIS — W19XXXA Unspecified fall, initial encounter: Secondary | ICD-10-CM | POA: Diagnosis not present

## 2018-11-11 DIAGNOSIS — Z79899 Other long term (current) drug therapy: Secondary | ICD-10-CM | POA: Insufficient documentation

## 2018-11-11 DIAGNOSIS — Y999 Unspecified external cause status: Secondary | ICD-10-CM | POA: Insufficient documentation

## 2018-11-11 DIAGNOSIS — S098XXA Other specified injuries of head, initial encounter: Secondary | ICD-10-CM | POA: Diagnosis present

## 2018-11-11 DIAGNOSIS — R220 Localized swelling, mass and lump, head: Secondary | ICD-10-CM | POA: Diagnosis not present

## 2018-11-11 DIAGNOSIS — S51812D Laceration without foreign body of left forearm, subsequent encounter: Secondary | ICD-10-CM | POA: Diagnosis not present

## 2018-11-11 DIAGNOSIS — S0003XA Contusion of scalp, initial encounter: Secondary | ICD-10-CM | POA: Diagnosis not present

## 2018-11-11 DIAGNOSIS — E860 Dehydration: Secondary | ICD-10-CM | POA: Diagnosis not present

## 2018-11-11 DIAGNOSIS — R51 Headache: Secondary | ICD-10-CM | POA: Diagnosis not present

## 2018-11-11 DIAGNOSIS — D72820 Lymphocytosis (symptomatic): Secondary | ICD-10-CM | POA: Diagnosis not present

## 2018-11-11 DIAGNOSIS — R0902 Hypoxemia: Secondary | ICD-10-CM | POA: Diagnosis not present

## 2018-11-11 DIAGNOSIS — C4442 Squamous cell carcinoma of skin of scalp and neck: Secondary | ICD-10-CM | POA: Diagnosis not present

## 2018-11-11 DIAGNOSIS — Z7401 Bed confinement status: Secondary | ICD-10-CM | POA: Diagnosis not present

## 2018-11-11 DIAGNOSIS — I10 Essential (primary) hypertension: Secondary | ICD-10-CM | POA: Diagnosis not present

## 2018-11-11 DIAGNOSIS — I69354 Hemiplegia and hemiparesis following cerebral infarction affecting left non-dominant side: Secondary | ICD-10-CM | POA: Diagnosis not present

## 2018-11-11 DIAGNOSIS — R5381 Other malaise: Secondary | ICD-10-CM | POA: Diagnosis not present

## 2018-11-11 DIAGNOSIS — M2550 Pain in unspecified joint: Secondary | ICD-10-CM | POA: Diagnosis not present

## 2018-11-11 DIAGNOSIS — R000 Tachycardia, unspecified: Secondary | ICD-10-CM | POA: Diagnosis not present

## 2018-11-11 HISTORY — DX: Malignant (primary) neoplasm, unspecified: C80.1

## 2018-11-11 LAB — URINALYSIS, ROUTINE W REFLEX MICROSCOPIC
Bilirubin Urine: NEGATIVE
Glucose, UA: NEGATIVE mg/dL
Hgb urine dipstick: NEGATIVE
Ketones, ur: NEGATIVE mg/dL
Leukocytes,Ua: NEGATIVE
Nitrite: NEGATIVE
Protein, ur: NEGATIVE mg/dL
Specific Gravity, Urine: 1.016 (ref 1.005–1.030)
pH: 5 (ref 5.0–8.0)

## 2018-11-11 LAB — BASIC METABOLIC PANEL
Anion gap: 11 (ref 5–15)
BUN: 55 mg/dL — ABNORMAL HIGH (ref 8–23)
CO2: 29 mmol/L (ref 22–32)
Calcium: 8.8 mg/dL — ABNORMAL LOW (ref 8.9–10.3)
Chloride: 90 mmol/L — ABNORMAL LOW (ref 98–111)
Creatinine, Ser: 1.84 mg/dL — ABNORMAL HIGH (ref 0.61–1.24)
GFR calc Af Amer: 37 mL/min — ABNORMAL LOW (ref >60)
GFR calc non Af Amer: 32 mL/min — ABNORMAL LOW (ref >60)
Glucose, Bld: 106 mg/dL — ABNORMAL HIGH (ref 70–99)
Potassium: 3.6 mmol/L (ref 3.5–5.1)
Sodium: 130 mmol/L — ABNORMAL LOW (ref 135–145)

## 2018-11-11 LAB — CBC WITH DIFFERENTIAL/PLATELET
Abs Immature Granulocytes: 0.02 10*3/uL (ref 0.00–0.07)
Basophils Absolute: 0 10*3/uL (ref 0.0–0.1)
Basophils Relative: 0 %
Eosinophils Absolute: 0.1 10*3/uL (ref 0.0–0.5)
Eosinophils Relative: 1 %
HCT: 31 % — ABNORMAL LOW (ref 39.0–52.0)
Hemoglobin: 10.2 g/dL — ABNORMAL LOW (ref 13.0–17.0)
Immature Granulocytes: 0 %
Lymphocytes Relative: 75 %
Lymphs Abs: 7.2 10*3/uL — ABNORMAL HIGH (ref 0.7–4.0)
MCH: 31.9 pg (ref 26.0–34.0)
MCHC: 32.9 g/dL (ref 30.0–36.0)
MCV: 96.9 fL (ref 80.0–100.0)
Monocytes Absolute: 0.2 10*3/uL (ref 0.1–1.0)
Monocytes Relative: 2 %
Neutro Abs: 2.1 10*3/uL (ref 1.7–7.7)
Neutrophils Relative %: 22 %
Platelets: 172 10*3/uL (ref 150–400)
RBC: 3.2 MIL/uL — ABNORMAL LOW (ref 4.22–5.81)
RDW: 15.9 % — ABNORMAL HIGH (ref 11.5–15.5)
WBC: 9.6 10*3/uL (ref 4.0–10.5)
nRBC: 0.3 % — ABNORMAL HIGH (ref 0.0–0.2)

## 2018-11-11 MED ORDER — MORPHINE SULFATE ER 15 MG PO TBCR
15.0000 mg | EXTENDED_RELEASE_TABLET | Freq: Once | ORAL | Status: AC
  Administered 2018-11-11: 15 mg via ORAL
  Filled 2018-11-11: qty 1

## 2018-11-11 NOTE — ED Notes (Signed)
In review of records from [facility], pt does take Eliquis.

## 2018-11-11 NOTE — ED Notes (Signed)
Pt speaking with his daughter on the telephone

## 2018-11-11 NOTE — ED Notes (Signed)
Pt to CT via stretcher

## 2018-11-11 NOTE — Discharge Instructions (Addendum)
Please change the bandage every day.  There were no lacerations to suture, but the wound is oozing blood.  It will take time to heal, therefore will need frequent bandage changes.

## 2018-11-11 NOTE — ED Notes (Signed)
Report called to Olivia Mackie at [facility]. Dr. Christy Gentles updated pts daughter Manuela Schwartz.

## 2018-11-11 NOTE — ED Provider Notes (Signed)
[place] EMERGENCY DEPARTMENT Provider Note   CSN: [id number] Arrival date & time: 11/11/18  0450     History   Chief Complaint Chief Complaint  Patient presents with  . Fall    HPI Craig Henry. is a 85 y.o. male.     The history is provided by the patient and the EMS personnel.  Fall This is a new problem. Episode onset: just prior to arrival. The problem occurs constantly. The problem has not changed since onset.Associated symptoms include headaches. Nothing aggravates the symptoms. Nothing relieves the symptoms.  Patient presents from nursing home with fall. Patient reports he was sitting up when he fell forward hitting his head. He reports headache.  No LOC.  Patient with history of squamous cell carcinoma of the scalp.  He has had multiple surgeries previously. Patient is  bleeding from his wound. Past Medical History:  Diagnosis Date  . Cancer [facility])     Patient Active Problem List   Diagnosis Date Noted  . Permanent atrial fibrillation 05/06/2018  . Late effect of cerebrovascular accident (CVA) 05/06/2018  . Preop cardiovascular exam 05/06/2018  . Dyspnea on exertion 05/06/2018    Past Surgical History:  Procedure Laterality Date  . CATARACT EXTRACTION    . REPLACEMENT TOTAL KNEE BILATERAL    . TONSILLECTOMY          Home Medications    Prior to Admission medications   Medication Sig Start Date End Date Taking? Authorizing Provider  apixaban (ELIQUIS) 2.5 MG TABS tablet Take 1 tablet by mouth 2 (two) times daily.    [provider]  atorvastatin (LIPITOR) 20 MG tablet Take 1 tablet by mouth daily.    [provider]  diltiazem (CARDIZEM CD) 240 MG 24 hr capsule Take 1 capsule by mouth daily.     [provider]  hydrochlorothiazide (HYDRODIURIL) 25 MG tablet Take 1 tablet by mouth daily.    [provider]  lactulose (CEPHULAC) 20 g packet Take 1 packet by mouth as needed.     [provider]    Family History Family History  Problem Relation Age of Onset  . Cerebral aneurysm Mother        Hemorrhaged  . Heart attack Father     Social History Social History   Tobacco Use  . Smoking status: Former Smoker    Types: Cigarettes  . Smokeless tobacco: Never Used  Substance Use Topics  . Alcohol use: Not Currently  . Drug use: Never     Allergies   Codeine   Review of Systems Review of Systems  Musculoskeletal: Negative for back pain and neck pain.  Skin: Positive for wound.  Neurological: Positive for headaches.  All other systems reviewed and are negative.    Physical Exam Updated Vital Signs BP (!) 166/77   Pulse 73   Temp 98.3 F (36.8 C) (Oral)   Resp 18   SpO2 97%   Physical Exam  CONSTITUTIONAL: Elderly, no acute distress HEAD: Large wound to right scalp that is oozing blood.  See photo below. EYES: PERRL ENMT: Mucous membranes moist NECK: supple no meningeal signs, soft tissue swelling to right neck, chronic in nature SPINE/BACK:entire spine nontender, no bruising/crepitance/stepoffs noted to spine CV: irregular LUNGS: Lungs are clear to auscultation bilaterally, no apparent distress Chest - no tenderness or crepitus ABDOMEN: soft, nontender, no rebound or guarding, bowel sounds noted throughout abdomen GU:no cva tenderness NEURO: Pt is awake/alert/appropriate, moves all extremitiesx4.  No facial droop.   EXTREMITIES: pulses normal/equal, full ROM, abrasion/skin tear to left forearm See photo below All other extremities/joints palpated/ranged and nontender SKIN: warm, color normal PSYCH: no abnormalities of mood noted, alert and oriented to situation   Patient gave verbal permission to utilize photo for medical documentation only The image was not stored on any personal device      ED Treatments / Results  Labs (all labs ordered are listed, but only abnormal results are displayed) Labs Reviewed  BASIC METABOLIC  PANEL - Abnormal; Notable for the following components:      Result Value   Sodium 130 (*)    Chloride 90 (*)    Glucose, Bld 106 (*)    BUN 55 (*)    Creatinine, Ser 1.84 (*)    Calcium 8.8 (*)    GFR calc non Af Amer 32 (*)    GFR calc Af Amer 37 (*)    All other components within normal limits  CBC WITH DIFFERENTIAL/PLATELET - Abnormal; Notable for the following components:   RBC 3.20 (*)    Hemoglobin 10.2 (*)    HCT 31.0 (*)    RDW 15.9 (*)    All other components within normal limits  URINALYSIS, ROUTINE W REFLEX MICROSCOPIC    EKG    EKG Interpretation  Date/Time:  Monday November 11 2018 05:26:18 EDT Ventricular Rate:  88 PR Interval:    QRS Duration: 125 QT Interval:  357 QTC Calculation: 432 R Axis:   -65 Text Interpretation:  Atrial fibrillation Multiple ventricular premature complexes Nonspecific IVCD with LAD Nonspecific T abnormalities, lateral leads No previous ECGs available Interpretation limited secondary to artifact Confirmed by Ripley Fraise [phone]) on 11/11/2018 5:28:21 AM       Radiology Ct Head Wo Contrast  Result Date: 11/11/2018 CLINICAL DATA:  Head trauma EXAM: CT HEAD WITHOUT CONTRAST TECHNIQUE: Contiguous axial images were obtained from the base of the skull through the vertex without intravenous contrast. COMPARISON:  None. FINDINGS: Brain: Extensive remote infarction involving cortex and white matter of the right MCA territory, primarily affecting the upper division. Small vessel ischemic gliosis in the cerebral white matter. Cerebral volume loss. Low-density expansion of space around the right cerebral convexity measuring up to 9 mm in thickness. No superimposed high-density component. No midline shift. There may also be a hygroma about the right cerebellum Vascular: Atherosclerotic calcification. Skull: Anterior scalp laceration and hematoma. There may also be a fungating scalp mass seen more inferiorly along the right temporal fossa. This likely  explains the heterogeneous masslike enlargement of the right parotid (lymphatic metastatic disease). If no scalp lesion, then the parotid finding would likely represent an aggressive primary parotid neoplasm. Sinuses/Orbits: Chronic left maxillary sinusitis with patent antrostomy. Bilateral cataract resection IMPRESSION: 1. Scalp hematoma and laceration likely superimposed on a large skin lesion. 2. 9 mm hygroma along the right cerebral convexity without midline shift. No acute intracranial hemorrhage. 3. Large right parotid mass, likely metastatic disease given evidence of skin lesion. 4. Extensive remote right MCA territory infarct. Electronically Signed   By: Monte Fantasia M.D.   On: 11/11/2018 06:39    Procedures Procedures  Medications Ordered in ED Medications  morphine (MS CONTIN) 12 hr tablet 15 mg (has no administration in time range)     Initial Impression / Assessment and Plan / ED Course  I have reviewed the triage vital signs and the nursing notes.  Pertinent labs & imaging results that were available during my care  of the patient were reviewed by me and considered in my medical decision making (see chart for details).        5:20 AM Patient with history of squamous cell carcinoma of the scalp, no bleeding from the wound after a fall. He is awake alert no acute distress.  Plan for CT imaging.  Labs are also pending.  Patient is a DNR He does not want me to call his daughter, she he reports she has already been called  Patient has history of atrial fibrillation, but no longer on anticoagulation 6:43 AM Patient is awake and alert at this time. He reports headache, but no other acute complaints.  No other signs of acute traumatic injury CT head reveals chronic findings, with hematoma and laceration, but no acute intracranial hemorrhage Labs reveal mild renal insufficiency, but similar to prior found in care everywhere Patient does have CLL, so will likely have leukocytosis  In terms of his wound, there were no actual large lacerations to repair.  He would need to have dressing changes and monitoring at his nursing facility. He reports the wound has been on his scalp for quite some time. Patient does not wish to have any other treatment this time.  Will discharge back to his facility. Final Clinical Impressions(s) / ED Diagnoses   Final diagnoses:  Laceration of scalp without foreign body, initial encounter  Dehydration    ED Discharge Orders    None       Ripley Fraise, MD 11/11/18 [phone]

## 2018-11-11 NOTE — ED Triage Notes (Signed)
Pt comes from [facility] via [facility] after a fall, pt has had 3 falls in the past 24 hours, hit head, no LOC, large laceration to R side of head, not on thinners, skin tear on L arm and R knee, pt has L sided weakness that is normal for him due to old stroke.

## 2018-11-11 NOTE — ED Notes (Signed)
[facility] called for transport.  

## 2018-11-11 NOTE — ED Notes (Signed)
Pt being sent back to [facility] via [facility]. VSS, NAD.

## 2018-11-12 DIAGNOSIS — I69354 Hemiplegia and hemiparesis following cerebral infarction affecting left non-dominant side: Secondary | ICD-10-CM | POA: Diagnosis not present

## 2018-11-12 DIAGNOSIS — C4442 Squamous cell carcinoma of skin of scalp and neck: Secondary | ICD-10-CM | POA: Diagnosis not present

## 2018-11-12 DIAGNOSIS — I10 Essential (primary) hypertension: Secondary | ICD-10-CM | POA: Diagnosis not present

## 2018-11-12 DIAGNOSIS — I4821 Permanent atrial fibrillation: Secondary | ICD-10-CM | POA: Diagnosis not present

## 2018-11-12 LAB — PATHOLOGIST SMEAR REVIEW: Path Review: 6222020

## 2018-11-13 DIAGNOSIS — R54 Age-related physical debility: Secondary | ICD-10-CM | POA: Diagnosis not present

## 2018-11-13 DIAGNOSIS — C4442 Squamous cell carcinoma of skin of scalp and neck: Secondary | ICD-10-CM | POA: Diagnosis not present

## 2018-11-13 DIAGNOSIS — L601 Onycholysis: Secondary | ICD-10-CM | POA: Diagnosis not present

## 2018-11-14 DIAGNOSIS — C4442 Squamous cell carcinoma of skin of scalp and neck: Secondary | ICD-10-CM | POA: Diagnosis not present

## 2018-11-14 DIAGNOSIS — I10 Essential (primary) hypertension: Secondary | ICD-10-CM | POA: Diagnosis not present

## 2018-11-14 DIAGNOSIS — I69354 Hemiplegia and hemiparesis following cerebral infarction affecting left non-dominant side: Secondary | ICD-10-CM | POA: Diagnosis not present

## 2018-11-14 DIAGNOSIS — I4821 Permanent atrial fibrillation: Secondary | ICD-10-CM | POA: Diagnosis not present

## 2018-11-15 DIAGNOSIS — I10 Essential (primary) hypertension: Secondary | ICD-10-CM | POA: Diagnosis not present

## 2018-11-15 DIAGNOSIS — I69354 Hemiplegia and hemiparesis following cerebral infarction affecting left non-dominant side: Secondary | ICD-10-CM | POA: Diagnosis not present

## 2018-11-15 DIAGNOSIS — I4821 Permanent atrial fibrillation: Secondary | ICD-10-CM | POA: Diagnosis not present

## 2018-11-15 DIAGNOSIS — C4442 Squamous cell carcinoma of skin of scalp and neck: Secondary | ICD-10-CM | POA: Diagnosis not present

## 2018-11-19 DIAGNOSIS — I4821 Permanent atrial fibrillation: Secondary | ICD-10-CM | POA: Diagnosis not present

## 2018-11-19 DIAGNOSIS — C4442 Squamous cell carcinoma of skin of scalp and neck: Secondary | ICD-10-CM | POA: Diagnosis not present

## 2018-11-19 DIAGNOSIS — I10 Essential (primary) hypertension: Secondary | ICD-10-CM | POA: Diagnosis not present

## 2018-11-19 DIAGNOSIS — I69354 Hemiplegia and hemiparesis following cerebral infarction affecting left non-dominant side: Secondary | ICD-10-CM | POA: Diagnosis not present

## 2018-11-20 DIAGNOSIS — I4821 Permanent atrial fibrillation: Secondary | ICD-10-CM | POA: Diagnosis not present

## 2018-11-20 DIAGNOSIS — C4442 Squamous cell carcinoma of skin of scalp and neck: Secondary | ICD-10-CM | POA: Diagnosis not present

## 2018-11-20 DIAGNOSIS — I10 Essential (primary) hypertension: Secondary | ICD-10-CM | POA: Diagnosis not present

## 2018-11-20 DIAGNOSIS — I69354 Hemiplegia and hemiparesis following cerebral infarction affecting left non-dominant side: Secondary | ICD-10-CM | POA: Diagnosis not present

## 2018-11-21 DIAGNOSIS — I4821 Permanent atrial fibrillation: Secondary | ICD-10-CM | POA: Diagnosis not present

## 2018-11-21 DIAGNOSIS — I69354 Hemiplegia and hemiparesis following cerebral infarction affecting left non-dominant side: Secondary | ICD-10-CM | POA: Diagnosis not present

## 2018-11-21 DIAGNOSIS — C4442 Squamous cell carcinoma of skin of scalp and neck: Secondary | ICD-10-CM | POA: Diagnosis not present

## 2018-11-21 DIAGNOSIS — I10 Essential (primary) hypertension: Secondary | ICD-10-CM | POA: Diagnosis not present

## 2018-11-25 DIAGNOSIS — I10 Essential (primary) hypertension: Secondary | ICD-10-CM | POA: Diagnosis not present

## 2018-11-25 DIAGNOSIS — C4442 Squamous cell carcinoma of skin of scalp and neck: Secondary | ICD-10-CM | POA: Diagnosis not present

## 2018-11-25 DIAGNOSIS — I69354 Hemiplegia and hemiparesis following cerebral infarction affecting left non-dominant side: Secondary | ICD-10-CM | POA: Diagnosis not present

## 2018-11-25 DIAGNOSIS — I4821 Permanent atrial fibrillation: Secondary | ICD-10-CM | POA: Diagnosis not present

## 2018-11-26 DIAGNOSIS — I10 Essential (primary) hypertension: Secondary | ICD-10-CM | POA: Diagnosis not present

## 2018-11-26 DIAGNOSIS — I69354 Hemiplegia and hemiparesis following cerebral infarction affecting left non-dominant side: Secondary | ICD-10-CM | POA: Diagnosis not present

## 2018-11-26 DIAGNOSIS — I4821 Permanent atrial fibrillation: Secondary | ICD-10-CM | POA: Diagnosis not present

## 2018-11-26 DIAGNOSIS — C4442 Squamous cell carcinoma of skin of scalp and neck: Secondary | ICD-10-CM | POA: Diagnosis not present

## 2018-11-27 DIAGNOSIS — C4442 Squamous cell carcinoma of skin of scalp and neck: Secondary | ICD-10-CM | POA: Diagnosis not present

## 2018-11-27 DIAGNOSIS — I10 Essential (primary) hypertension: Secondary | ICD-10-CM | POA: Diagnosis not present

## 2018-11-27 DIAGNOSIS — Z515 Encounter for palliative care: Secondary | ICD-10-CM | POA: Diagnosis not present

## 2018-11-27 DIAGNOSIS — G893 Neoplasm related pain (acute) (chronic): Secondary | ICD-10-CM | POA: Diagnosis not present

## 2018-11-27 DIAGNOSIS — I4821 Permanent atrial fibrillation: Secondary | ICD-10-CM | POA: Diagnosis not present

## 2018-11-27 DIAGNOSIS — I69354 Hemiplegia and hemiparesis following cerebral infarction affecting left non-dominant side: Secondary | ICD-10-CM | POA: Diagnosis not present

## 2018-11-27 DIAGNOSIS — R54 Age-related physical debility: Secondary | ICD-10-CM | POA: Diagnosis not present

## 2018-11-29 DIAGNOSIS — W19XXXA Unspecified fall, initial encounter: Secondary | ICD-10-CM | POA: Diagnosis not present

## 2018-11-29 DIAGNOSIS — I4821 Permanent atrial fibrillation: Secondary | ICD-10-CM | POA: Diagnosis not present

## 2018-11-29 DIAGNOSIS — I10 Essential (primary) hypertension: Secondary | ICD-10-CM | POA: Diagnosis not present

## 2018-11-29 DIAGNOSIS — R269 Unspecified abnormalities of gait and mobility: Secondary | ICD-10-CM | POA: Diagnosis not present

## 2018-11-29 DIAGNOSIS — Z9181 History of falling: Secondary | ICD-10-CM | POA: Diagnosis not present

## 2018-11-29 DIAGNOSIS — S0990XS Unspecified injury of head, sequela: Secondary | ICD-10-CM | POA: Diagnosis not present

## 2018-11-29 DIAGNOSIS — C4442 Squamous cell carcinoma of skin of scalp and neck: Secondary | ICD-10-CM | POA: Diagnosis not present

## 2018-11-29 DIAGNOSIS — R54 Age-related physical debility: Secondary | ICD-10-CM | POA: Diagnosis not present

## 2018-11-29 DIAGNOSIS — R296 Repeated falls: Secondary | ICD-10-CM | POA: Diagnosis not present

## 2018-11-29 DIAGNOSIS — M6281 Muscle weakness (generalized): Secondary | ICD-10-CM | POA: Diagnosis not present

## 2018-11-29 DIAGNOSIS — I69354 Hemiplegia and hemiparesis following cerebral infarction affecting left non-dominant side: Secondary | ICD-10-CM | POA: Diagnosis not present

## 2018-12-01 DIAGNOSIS — C4442 Squamous cell carcinoma of skin of scalp and neck: Secondary | ICD-10-CM | POA: Diagnosis not present

## 2018-12-01 DIAGNOSIS — I4821 Permanent atrial fibrillation: Secondary | ICD-10-CM | POA: Diagnosis not present

## 2018-12-01 DIAGNOSIS — I69354 Hemiplegia and hemiparesis following cerebral infarction affecting left non-dominant side: Secondary | ICD-10-CM | POA: Diagnosis not present

## 2018-12-01 DIAGNOSIS — I10 Essential (primary) hypertension: Secondary | ICD-10-CM | POA: Diagnosis not present

## 2018-12-02 DIAGNOSIS — C4442 Squamous cell carcinoma of skin of scalp and neck: Secondary | ICD-10-CM | POA: Diagnosis not present

## 2018-12-02 DIAGNOSIS — I10 Essential (primary) hypertension: Secondary | ICD-10-CM | POA: Diagnosis not present

## 2018-12-02 DIAGNOSIS — I4821 Permanent atrial fibrillation: Secondary | ICD-10-CM | POA: Diagnosis not present

## 2018-12-02 DIAGNOSIS — I69354 Hemiplegia and hemiparesis following cerebral infarction affecting left non-dominant side: Secondary | ICD-10-CM | POA: Diagnosis not present

## 2018-12-03 DIAGNOSIS — I4821 Permanent atrial fibrillation: Secondary | ICD-10-CM | POA: Diagnosis not present

## 2018-12-03 DIAGNOSIS — C4442 Squamous cell carcinoma of skin of scalp and neck: Secondary | ICD-10-CM | POA: Diagnosis not present

## 2018-12-03 DIAGNOSIS — I69354 Hemiplegia and hemiparesis following cerebral infarction affecting left non-dominant side: Secondary | ICD-10-CM | POA: Diagnosis not present

## 2018-12-03 DIAGNOSIS — I10 Essential (primary) hypertension: Secondary | ICD-10-CM | POA: Diagnosis not present

## 2018-12-21 DEATH — deceased

## 2019-02-06 DIAGNOSIS — Z23 Encounter for immunization: Secondary | ICD-10-CM | POA: Diagnosis not present

## 2019-07-05 IMAGING — CT CT HEAD WITHOUT CONTRAST
4 series · 16 of 47 positions shown, 18 images · non-contrast
Comparison: None.

CLINICAL DATA: Head trauma

EXAM:
CT HEAD WITHOUT CONTRAST
TECHNIQUE: Contiguous axial images were obtained from the base of the skull
through the vertex without intravenous contrast.

[Series 3: head without · axial · non-contrast · 0.46mm/px · z∈[-76,+49]mm · 7 of 35 slices shown, 9 images]
[im 5/35  brain]
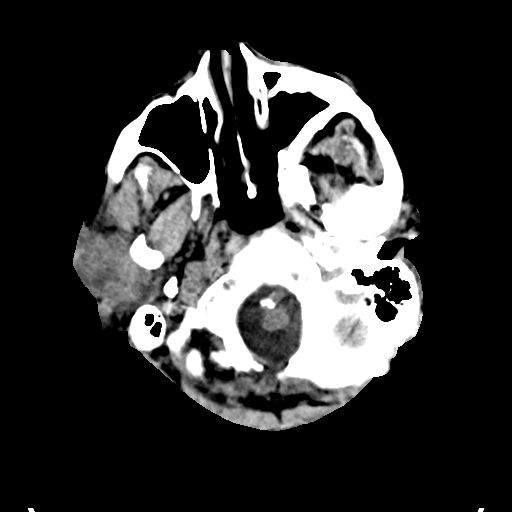
[im 5/35  bone]
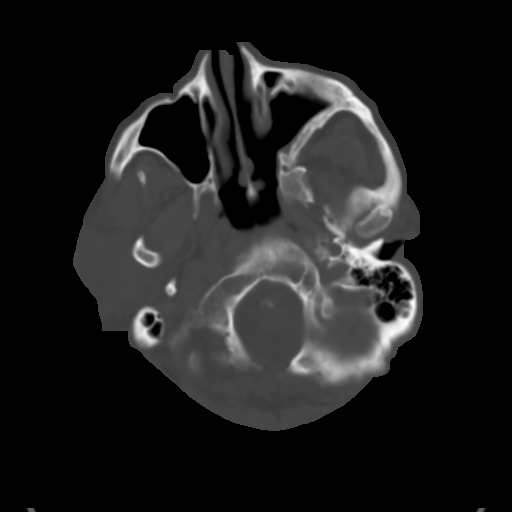
[im 9/35  brain]
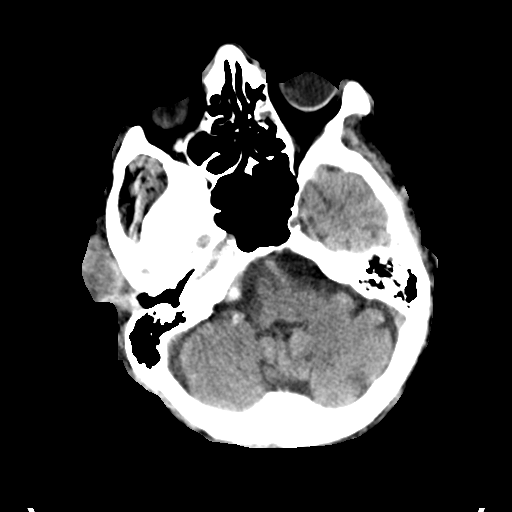
[im 13/35  brain]
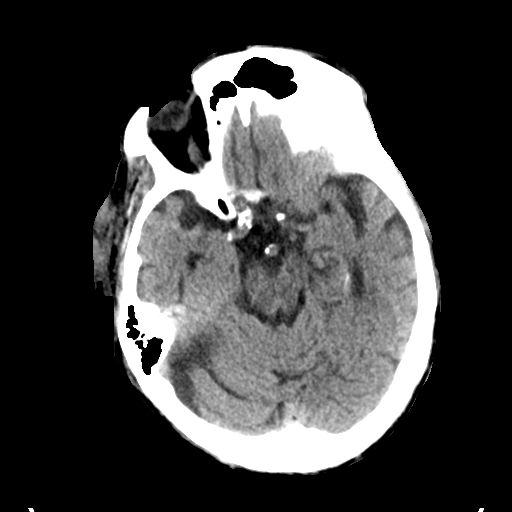
[im 18/35  brain]
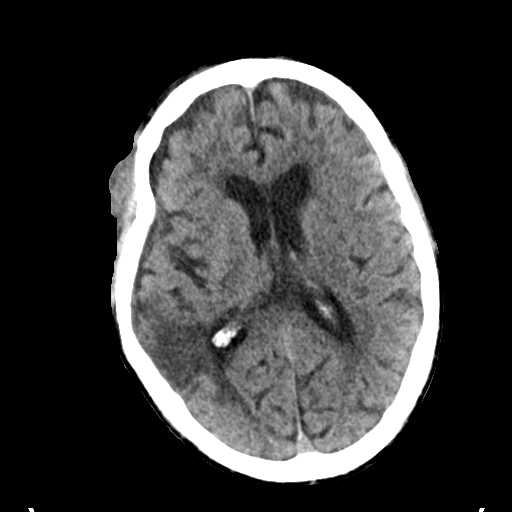
[im 22/35  brain]
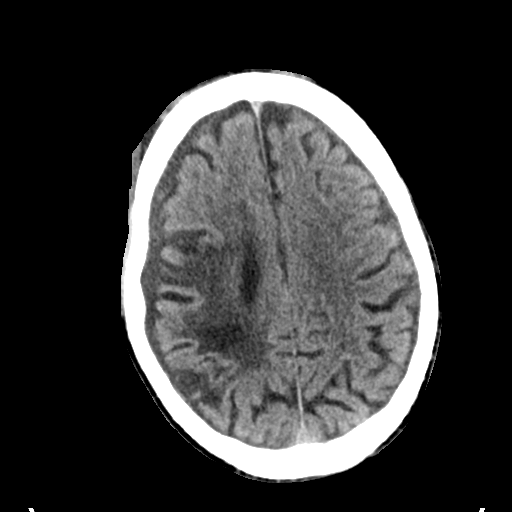
[im 22/35  bone]
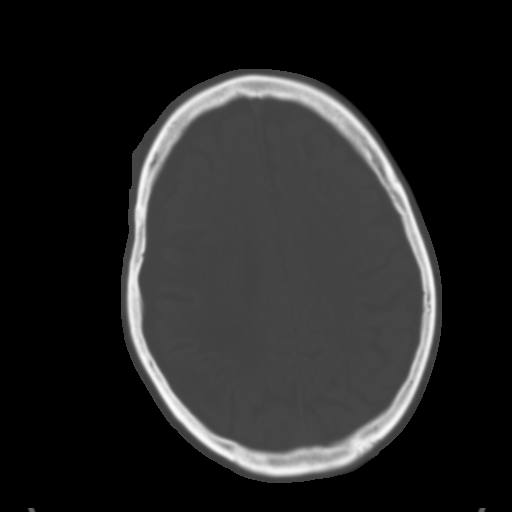
[im 26/35  brain]
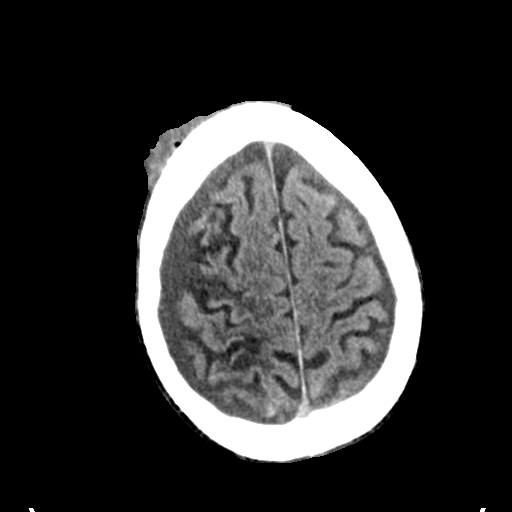
[im 30/35  brain]
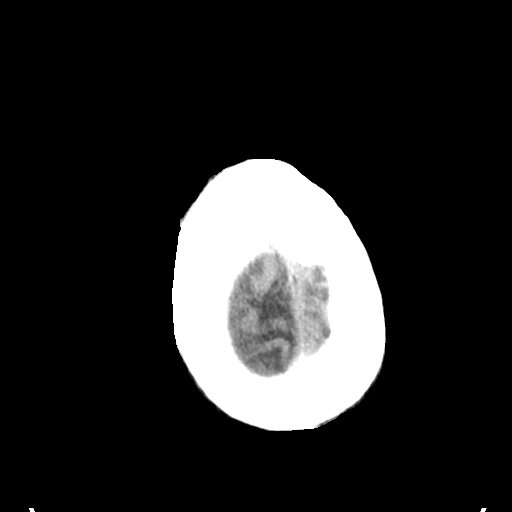

[Series 4: head bone · axial · 0.46mm/px · z∈[-80,-46]mm · 3 of 86 slices shown]
[im 9/86  bone]
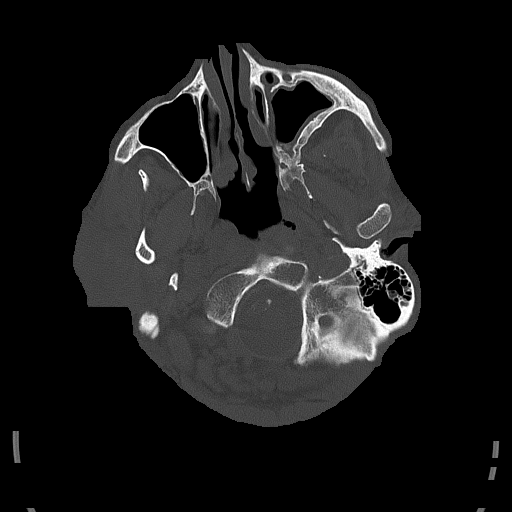
[im 18/86  bone]
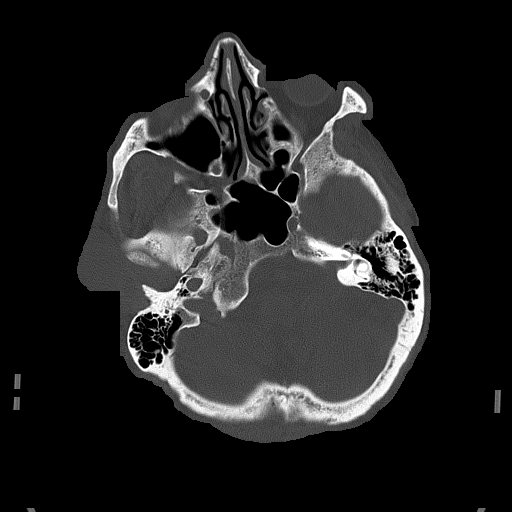
[im 26/86  bone]
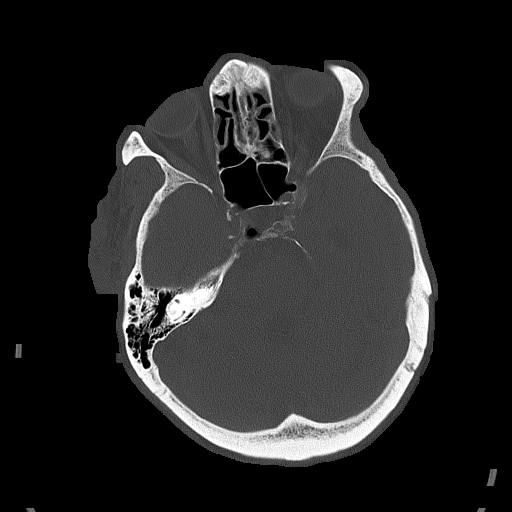

[Series 5: head without cor · coronal · non-contrast · 0.35mm/px · 3 of 74 slices shown]
[im 25/74  brain]
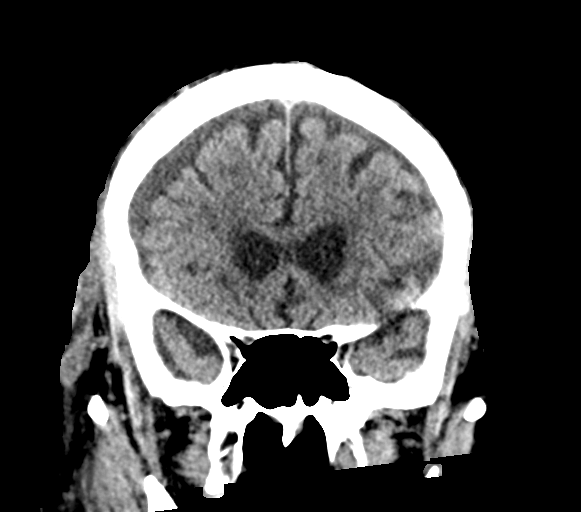
[im 33/74  brain]
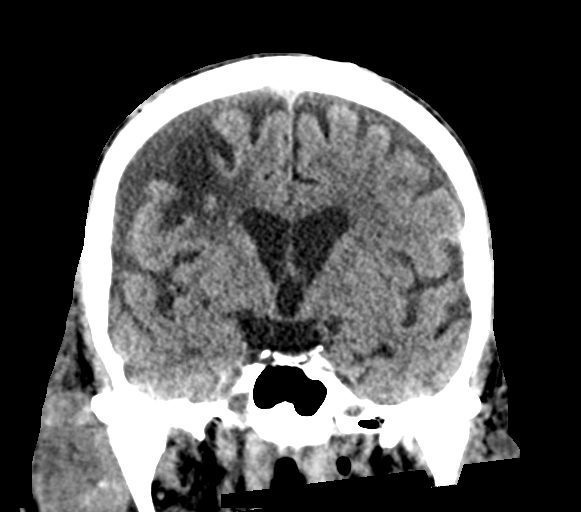
[im 41/74  brain]
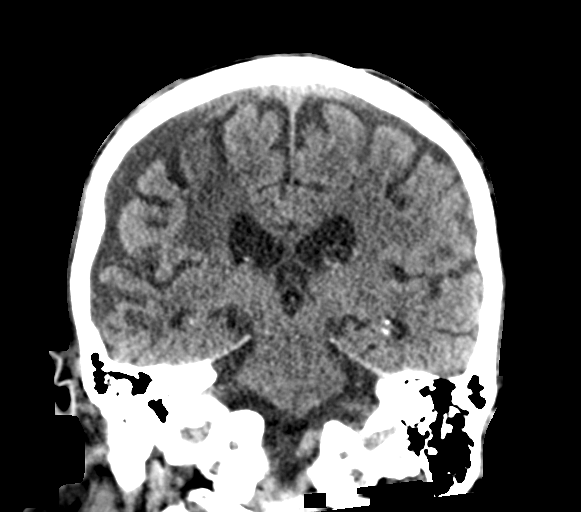

[Series 6: head without sag · sagittal · non-contrast · 0.35mm/px · 3 of 67 slices shown]
[im 23/67  brain]
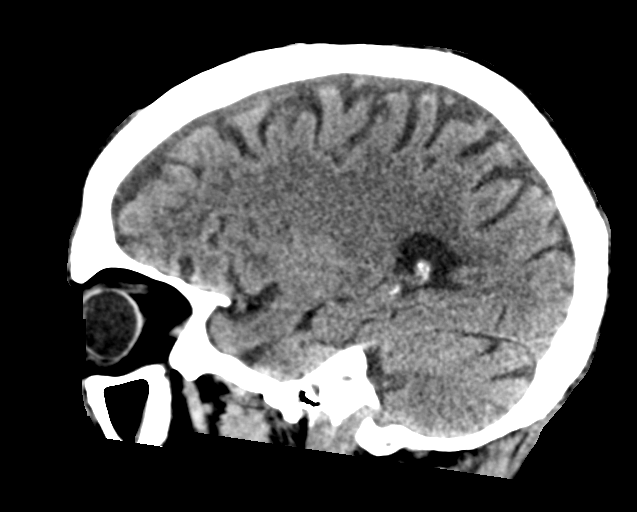
[im 34/67  brain]
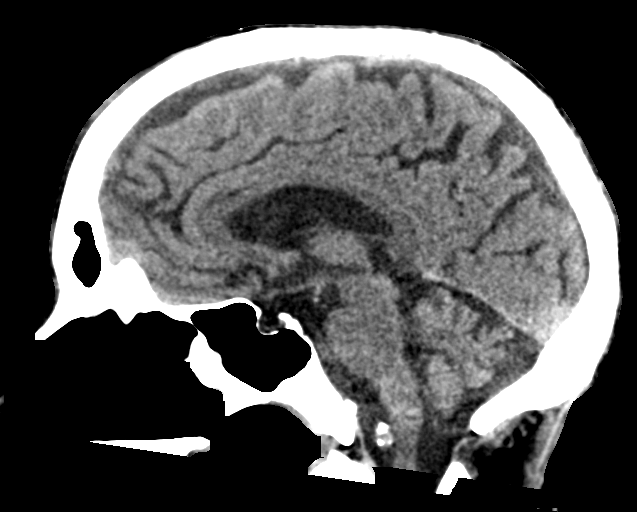
[im 45/67  brain]
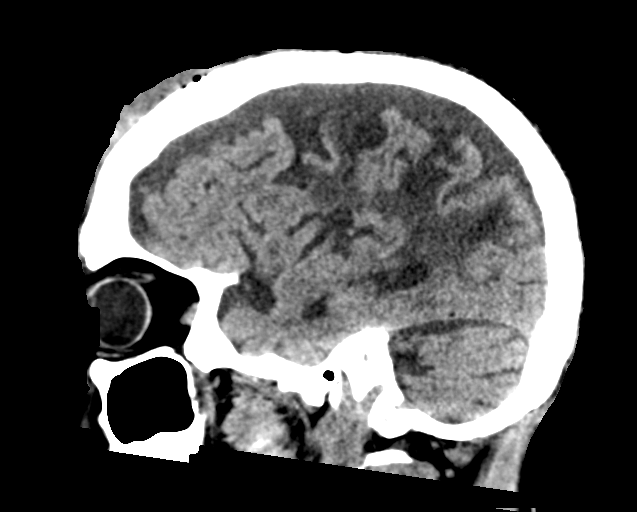

[16 of 47 positions shown; findings below may reference images not displayed]

FINDINGS: Brain: Extensive remote infarction involving cortex and white matter
of the right MCA territory, primarily affecting the upper division.
Small vessel ischemic gliosis in the cerebral white matter. Cerebral
volume loss. Low-density expansion of space around the right
cerebral convexity measuring up to 9 mm in thickness. No
superimposed high-density component. No midline shift. There may
also be a hygroma about the right cerebellum

Vascular: Atherosclerotic calcification.

Skull: Anterior scalp laceration and hematoma. There may also be a
fungating scalp mass seen more inferiorly along the right temporal
fossa. This likely explains the heterogeneous masslike enlargement
of the right parotid (lymphatic metastatic disease). If no scalp
lesion, then the parotid finding would likely represent an
aggressive primary parotid neoplasm.

Sinuses/Orbits: Chronic left maxillary sinusitis with patent
antrostomy. Bilateral cataract resection
IMPRESSION: 1. Scalp hematoma and laceration likely superimposed on a large skin
lesion.
2. 9 mm hygroma along the right cerebral convexity without midline
shift. No acute intracranial hemorrhage.
3. Large right parotid mass, likely metastatic disease given
evidence of skin lesion.
4. Extensive remote right MCA territory infarct.
